# Patient Record
Sex: Male | Born: 1945 | Race: White | Hispanic: No | Marital: Married | Smoking: Never smoker
Health system: Southern US, Community
[De-identification: ages and names within clinical notes are randomized; demographics above are authoritative.]

## PROBLEM LIST (undated history)

## (undated) DIAGNOSIS — M1990 Unspecified osteoarthritis, unspecified site: Secondary | ICD-10-CM

## (undated) DIAGNOSIS — A045 Campylobacter enteritis: Secondary | ICD-10-CM

## (undated) DIAGNOSIS — Z8709 Personal history of other diseases of the respiratory system: Secondary | ICD-10-CM

## (undated) DIAGNOSIS — I4891 Unspecified atrial fibrillation: Principal | ICD-10-CM

## (undated) DIAGNOSIS — E785 Hyperlipidemia, unspecified: Secondary | ICD-10-CM

## (undated) DIAGNOSIS — C61 Malignant neoplasm of prostate: Secondary | ICD-10-CM

## (undated) DIAGNOSIS — G4733 Obstructive sleep apnea (adult) (pediatric): Secondary | ICD-10-CM

## (undated) DIAGNOSIS — N400 Enlarged prostate without lower urinary tract symptoms: Secondary | ICD-10-CM

## (undated) DIAGNOSIS — I10 Essential (primary) hypertension: Secondary | ICD-10-CM

## (undated) HISTORY — DX: Obstructive sleep apnea (adult) (pediatric): G47.33

---

## 1973-11-27 HISTORY — PX: OTHER SURGICAL HISTORY: SHX169

## 1998-06-01 ENCOUNTER — Encounter: Admission: RE | Admit: 1998-06-01 | Discharge: 1998-06-01 | Payer: Self-pay | Admitting: Family Medicine

## 1998-07-16 ENCOUNTER — Encounter: Admission: RE | Admit: 1998-07-16 | Discharge: 1998-07-16 | Payer: Self-pay | Admitting: Sports Medicine

## 2012-08-04 ENCOUNTER — Other Ambulatory Visit: Payer: Self-pay | Admitting: Orthopedic Surgery

## 2012-08-04 MED ORDER — DEXAMETHASONE SODIUM PHOSPHATE 10 MG/ML IJ SOLN: 10.0000 mg | Freq: Once | INTRAMUSCULAR | Status: DC

## 2012-08-04 NOTE — Progress Notes (Signed)
Preoperative surgical orders have been place into the Epic hospital system for William Combs on 08/04/2012, 12:14 PM  by Patrica Duel for surgery on 09/25/12.  Preop Knee Scope orders including IV Tylenol and IV Decadron as long as there are no contraindications to the above medications. Avel Peace, PA-C

## 2012-09-17 ENCOUNTER — Encounter (HOSPITAL_COMMUNITY): Payer: Self-pay | Admitting: Pharmacy Technician

## 2012-09-20 NOTE — Patient Instructions (Addendum)
William Combs  09/20/2012   Your procedure is scheduled on:  09/25/12 [id number]  Report to [facility] at 0715 AM.  Call this number if you have problems the morning of surgery: [phone]   Remember:   Do not eat food:After Midnight.  May have clear liquids:until Midnight .    Take these medicines the morning of surgery with A SIP OF WATER:    Do not wear jewelry,   Do not wear lotions, powders, or perfumes.  Do not shave 48 hours prior to surgery.   Do not bring valuables to the hospital.  Contacts, dentures or bridgework may not be worn into surgery.     Patients discharged the day of surgery will not be allowed to drive home.  Name and phone number of your driver:  SEE [facility]G INSTRUCTION SHEET    Please read over the following fact sheets that you were given: MRSA Information, coughing and deep breathing exercises, leg exericises, Incentive Spirometry Fact Sheet

## 2012-09-23 ENCOUNTER — Encounter (HOSPITAL_COMMUNITY)
Admission: RE | Admit: 2012-09-23 | Discharge: 2012-09-23 | Disposition: A | Discharge status: Home / Self Care | Payer: Medicare Other | Source: Ambulatory Visit | Attending: Orthopedic Surgery | Admitting: Orthopedic Surgery

## 2012-09-23 ENCOUNTER — Ambulatory Visit (HOSPITAL_COMMUNITY)
Admission: RE | Admit: 2012-09-23 | Discharge: 2012-09-23 | Disposition: A | Discharge status: Home / Self Care | Payer: Medicare Other | Source: Ambulatory Visit | Attending: Orthopedic Surgery | Admitting: Orthopedic Surgery

## 2012-09-23 ENCOUNTER — Encounter (HOSPITAL_COMMUNITY): Payer: Self-pay

## 2012-09-23 DIAGNOSIS — Z01818 Encounter for other preprocedural examination: Secondary | ICD-10-CM | POA: Insufficient documentation

## 2012-09-23 DIAGNOSIS — I10 Essential (primary) hypertension: Secondary | ICD-10-CM | POA: Insufficient documentation

## 2012-09-23 DIAGNOSIS — Z01812 Encounter for preprocedural laboratory examination: Secondary | ICD-10-CM | POA: Insufficient documentation

## 2012-09-23 HISTORY — DX: Essential (primary) hypertension: I10

## 2012-09-23 HISTORY — DX: Unspecified osteoarthritis, unspecified site: M19.90

## 2012-09-23 LAB — CBC
HCT: 41.1 % (ref 39.0–52.0)
MCH: 32.2 pg (ref 26.0–34.0)
MCHC: 33.8 g/dL (ref 30.0–36.0)
MCV: 95.1 fL (ref 78.0–100.0)
RDW: 12.6 % (ref 11.5–15.5)

## 2012-09-23 LAB — BASIC METABOLIC PANEL
BUN: 18 mg/dL (ref 6–23)
Calcium: 9.4 mg/dL (ref 8.4–10.5)
Creatinine, Ser: 0.94 mg/dL (ref 0.50–1.35)
GFR calc Af Amer: >90 mL/min (ref >90)
GFR calc non Af Amer: 85 mL/min — ABNORMAL LOW (ref >90)

## 2012-09-25 ENCOUNTER — Ambulatory Visit (HOSPITAL_COMMUNITY)
Admission: RE | Admit: 2012-09-25 | Discharge: 2012-09-25 | Disposition: A | Discharge status: Home / Self Care | Payer: Medicare Other | Source: Ambulatory Visit | Attending: Orthopedic Surgery | Admitting: Orthopedic Surgery

## 2012-09-25 ENCOUNTER — Encounter (HOSPITAL_COMMUNITY): Payer: Self-pay | Admitting: *Deleted

## 2012-09-25 ENCOUNTER — Ambulatory Visit (HOSPITAL_COMMUNITY): Payer: Medicare Other | Admitting: Anesthesiology

## 2012-09-25 ENCOUNTER — Encounter (HOSPITAL_COMMUNITY): Payer: Self-pay | Admitting: Anesthesiology

## 2012-09-25 ENCOUNTER — Encounter (HOSPITAL_COMMUNITY)
Admission: RE | Disposition: A | Discharge status: Home / Self Care | Payer: Self-pay | Source: Ambulatory Visit | Attending: Orthopedic Surgery

## 2012-09-25 DIAGNOSIS — IMO0002 Reserved for concepts with insufficient information to code with codable children: Secondary | ICD-10-CM | POA: Insufficient documentation

## 2012-09-25 DIAGNOSIS — Z01812 Encounter for preprocedural laboratory examination: Secondary | ICD-10-CM | POA: Insufficient documentation

## 2012-09-25 DIAGNOSIS — X58XXXA Exposure to other specified factors, initial encounter: Secondary | ICD-10-CM | POA: Insufficient documentation

## 2012-09-25 DIAGNOSIS — S83289A Other tear of lateral meniscus, current injury, unspecified knee, initial encounter: Secondary | ICD-10-CM | POA: Diagnosis present

## 2012-09-25 DIAGNOSIS — Z79899 Other long term (current) drug therapy: Secondary | ICD-10-CM | POA: Insufficient documentation

## 2012-09-25 DIAGNOSIS — I10 Essential (primary) hypertension: Secondary | ICD-10-CM | POA: Insufficient documentation

## 2012-09-25 HISTORY — PX: KNEE ARTHROSCOPY: SHX127

## 2012-09-25 SURGERY — ARTHROSCOPY, KNEE
Anesthesia: General | Site: Knee | Laterality: Left | Wound class: Clean

## 2012-09-25 MED ORDER — CHLORHEXIDINE GLUCONATE 4 % EX LIQD
60.0000 mL | Freq: Once | CUTANEOUS | Status: DC
  Filled 2012-09-25: qty 60

## 2012-09-25 MED ORDER — BUPIVACAINE-EPINEPHRINE 0.25% -1:200000 IJ SOLN
INTRAMUSCULAR | Status: AC
  Filled 2012-09-25: qty 1

## 2012-09-25 MED ORDER — OXYCODONE-ACETAMINOPHEN 5-325 MG PO TABS: 1.0000 | ORAL_TABLET | ORAL | Status: DC | PRN

## 2012-09-25 MED ORDER — CEFAZOLIN SODIUM-DEXTROSE 2-3 GM-% IV SOLR
2.0000 g | INTRAVENOUS | Status: AC
  Administered 2012-09-25: 2 g via INTRAVENOUS

## 2012-09-25 MED ORDER — LACTATED RINGERS IV SOLN: INTRAVENOUS | Status: DC

## 2012-09-25 MED ORDER — LACTATED RINGERS IR SOLN
Status: DC | PRN
  Administered 2012-09-25: 9000 mL

## 2012-09-25 MED ORDER — LACTATED RINGERS IV SOLN
INTRAVENOUS | Status: DC | PRN
  Administered 2012-09-25: 08:00:00 via INTRAVENOUS

## 2012-09-25 MED ORDER — BUPIVACAINE-EPINEPHRINE 0.25% -1:200000 IJ SOLN
INTRAMUSCULAR | Status: DC | PRN
  Administered 2012-09-25: 20 mL

## 2012-09-25 MED ORDER — ACETAMINOPHEN 10 MG/ML IV SOLN: 1000.0000 mg | Freq: Once | INTRAVENOUS | Status: DC

## 2012-09-25 MED ORDER — OXYCODONE-ACETAMINOPHEN 5-325 MG PO TABS
1.0000 | ORAL_TABLET | ORAL | Status: DC | PRN
  Administered 2012-09-25: 1 via ORAL
  Filled 2012-09-25: qty 1

## 2012-09-25 MED ORDER — FENTANYL CITRATE 0.05 MG/ML IJ SOLN
INTRAMUSCULAR | Status: AC
  Filled 2012-09-25: qty 2

## 2012-09-25 MED ORDER — FENTANYL CITRATE 0.05 MG/ML IJ SOLN
INTRAMUSCULAR | Status: DC | PRN
  Administered 2012-09-25: 50 ug via INTRAVENOUS
  Administered 2012-09-25 (×4): 25 ug via INTRAVENOUS
  Administered 2012-09-25: 100 ug via INTRAVENOUS

## 2012-09-25 MED ORDER — PROMETHAZINE HCL 25 MG/ML IJ SOLN: 6.2500 mg | INTRAMUSCULAR | Status: DC | PRN

## 2012-09-25 MED ORDER — METHOCARBAMOL 500 MG PO TABS: 500.0000 mg | ORAL_TABLET | Freq: Four times a day (QID) | ORAL | Status: DC | PRN

## 2012-09-25 MED ORDER — METHOCARBAMOL 500 MG PO TABS: 500.0000 mg | ORAL_TABLET | Freq: Four times a day (QID) | ORAL | Status: DC

## 2012-09-25 MED ORDER — ACETAMINOPHEN 10 MG/ML IV SOLN
INTRAVENOUS | Status: DC | PRN
  Administered 2012-09-25: 1000 mg via INTRAVENOUS

## 2012-09-25 MED ORDER — CEFAZOLIN SODIUM-DEXTROSE 2-3 GM-% IV SOLR
INTRAVENOUS | Status: AC
  Filled 2012-09-25: qty 50

## 2012-09-25 MED ORDER — DEXAMETHASONE SODIUM PHOSPHATE 10 MG/ML IJ SOLN
INTRAMUSCULAR | Status: DC | PRN
  Administered 2012-09-25: 10 mg via INTRAVENOUS

## 2012-09-25 MED ORDER — ACETAMINOPHEN 10 MG/ML IV SOLN
INTRAVENOUS | Status: AC
  Filled 2012-09-25: qty 100

## 2012-09-25 MED ORDER — PROPOFOL 10 MG/ML IV BOLUS
INTRAVENOUS | Status: DC | PRN
  Administered 2012-09-25: 150 mg via INTRAVENOUS

## 2012-09-25 MED ORDER — MIDAZOLAM HCL 5 MG/5ML IJ SOLN
INTRAMUSCULAR | Status: DC | PRN
  Administered 2012-09-25: 2 mg via INTRAVENOUS

## 2012-09-25 MED ORDER — DEXTROSE 5 % IV SOLN: 3.0000 g | INTRAVENOUS | Status: DC

## 2012-09-25 MED ORDER — ONDANSETRON HCL 4 MG/2ML IJ SOLN
INTRAMUSCULAR | Status: DC | PRN
  Administered 2012-09-25: 4 mg via INTRAVENOUS

## 2012-09-25 MED ORDER — FENTANYL CITRATE 0.05 MG/ML IJ SOLN
25.0000 ug | INTRAMUSCULAR | Status: DC | PRN
  Administered 2012-09-25: 50 ug via INTRAVENOUS

## 2012-09-25 MED ORDER — SODIUM CHLORIDE 0.9 % IV SOLN: INTRAVENOUS | Status: DC

## 2012-09-25 MED ORDER — BUPIVACAINE-EPINEPHRINE PF 0.25-1:200000 % IJ SOLN
INTRAMUSCULAR | Status: AC
  Filled 2012-09-25: qty 30

## 2012-09-25 MED ORDER — MEPERIDINE HCL 50 MG/ML IJ SOLN: 6.2500 mg | INTRAMUSCULAR | Status: DC | PRN

## 2012-09-25 SURGICAL SUPPLY — 24 items
BLADE 4.2CUDA (BLADE) ×2 IMPLANT
CLOTH BEACON ORANGE TIMEOUT ST (SAFETY) ×2 IMPLANT
CUFF TOURN SGL QUICK 34 (TOURNIQUET CUFF) ×2
CUFF TRNQT CYL 34X4X40X1 (TOURNIQUET CUFF) ×1 IMPLANT
DRAPE U-SHAPE 47X51 STRL (DRAPES) ×2 IMPLANT
DRSG EMULSION OIL 3X3 NADH (GAUZE/BANDAGES/DRESSINGS) ×2 IMPLANT
DRSG PAD ABDOMINAL 8X10 ST (GAUZE/BANDAGES/DRESSINGS) ×2 IMPLANT
DURAPREP 26ML APPLICATOR (WOUND CARE) ×2 IMPLANT
GLOVE BIO SURGEON STRL SZ7.5 (GLOVE) ×2 IMPLANT
GLOVE BIO SURGEON STRL SZ8 (GLOVE) ×2 IMPLANT
GLOVE BIOGEL PI IND STRL 8 (GLOVE) ×1 IMPLANT
GLOVE BIOGEL PI INDICATOR 8 (GLOVE) ×4
GOWN STRL NON-REIN LRG LVL3 (GOWN DISPOSABLE) ×2 IMPLANT
MANIFOLD NEPTUNE II (INSTRUMENTS) ×3 IMPLANT
PACK ARTHROSCOPY WL (CUSTOM PROCEDURE TRAY) ×2 IMPLANT
PACK ICE MAXI GEL EZY WRAP (MISCELLANEOUS) ×6 IMPLANT
PADDING CAST COTTON 6X4 STRL (CAST SUPPLIES) ×2 IMPLANT
POSITIONER SURGICAL ARM (MISCELLANEOUS) ×2 IMPLANT
SET ARTHROSCOPY TUBING (MISCELLANEOUS) ×2
SET ARTHROSCOPY TUBING LN (MISCELLANEOUS) ×1 IMPLANT
SUT ETHILON 4 0 PS 2 18 (SUTURE) ×2 IMPLANT
TOWEL OR 17X26 10 PK STRL BLUE (TOWEL DISPOSABLE) ×2 IMPLANT
WAND 90 DEG TURBOVAC W/CORD (SURGICAL WAND) ×2 IMPLANT
WRAP KNEE MAXI GEL POST OP (GAUZE/BANDAGES/DRESSINGS) ×3 IMPLANT

## 2012-09-25 NOTE — Interval H&P Note (Signed)
History and Physical Interval Note:  09/25/2012 8:44 AM  William Combs  has presented today for surgery, with the diagnosis of left knee lateral meniscus tear  The various methods of treatment have been discussed with the patient and family. After consideration of risks, benefits and other options for treatment, the patient has consented to  Procedure(s) (LRB) with comments: ARTHROSCOPY KNEE (Left) - with Debridement as a surgical intervention .  The patient's history has been reviewed, patient examined, no change in status, stable for surgery.  I have reviewed the patient's chart and labs.  Questions were answered to the patient's satisfaction.     Loanne Drilling

## 2012-09-25 NOTE — Op Note (Signed)
Preoperative diagnosis-  Left knee lateral meniscal tear  Postoperative diagnosis Left- knee lateral meniscal tear   Plus Left medial meniscal tear  Procedure- Left knee arthroscopy with medial and lateral Meniscal debridement    Surgeon- Gus Rankin. Shmiel Morton, MD  Anesthesia-General  EBL-  minimal Complications- None  Condition- PACU - hemodynamically stable.  Brief clinical note- -William Combs is a 66 y.o.  male with a several month history of left knee pain and mechanical symptoms. Exam and history suggested lateral meniscal tear confirmed by MRI. The patient presents now for arthroscopy and debridement   Procedure in detail -       After successful administration of General anesthetic, a tourmiquet is placed high on the Left  thigh and the Left lower extremity is prepped and draped in the usual sterile fashion. Time out is performed by the surgical team. Standard superomedial and inferolateral portal sites are marked and incisions made with an 11 blade. The inflow cannula is passed through the superomedial portal and camera through the inferolateral portal and inflow is initiated. Arthroscopic visualization proceeds.      The undersurface of the patella and trochlea are visualized and they are normal. The medial and lateral gutters are visualized and there are  no loose bodies. Flexion and valgus force is applied to the knee and the medial compartment is entered. A spinal needle is passed into the joint through the site marked for the inferomedial portal. A small incision is made and the dilator passed into the joint. The findings for the medial compartment are small tear posterior horn medial meniscus . The tear is debrided to a stable base with baskets and a shaver and sealed off with the Arthrocare.     The intercondylar notch is visualized and the ACL appears normal. The lateral compartment is entered and the findings are degenerative tear body and posterior horn lateral meniscus  without any associated chondral defects. The tear is debrided to a stable base with baskets and a shaver and sealed off with the Arthrocare.      The joint is again inspected and there are no other tears, defects or loose bodies identified. The arthroscopic equipment is then removed from the inferior portals which are closed with interrupted 4-0 nylon. 20 ml of .25% Marcaine with epinephrine are injected through the inflow cannula and the cannula is then removed and the portal closed with nylon. The incisions are cleaned and dried and a bulky sterile dressing is applied. The patient is then awakened and transported to recovery in stable condition.   09/25/2012, 9:41 AM

## 2012-09-25 NOTE — H&P (Signed)
  CC- William Combs is a 66 y.o. male who presents with left knee pain.  HPI- . Knee Pain: Patient presents with knee pain involving the  left knee. Onset of the symptoms was several months ago. Inciting event: none known. Current symptoms include giving out, pain located laterally and stiffness. Pain is aggravated by lateral movements, pivoting, rising after sitting and squatting.  Patient has had no prior knee problems. Evaluation to date: MRI: abnormal lateral meniscal tear. Treatment to date: rest.  Past Medical History  Diagnosis Date  . Hypertension   . Asthma     hxof athletically induced asthma   . Arthritis     Past Surgical History  Procedure Date  . Right knee surgery    . Wisdom tooth extraction   . Refractive surgery     Prior to Admission medications   Medication Sig Start Date End Date Taking? Authorizing Provider  aspirin EC 81 MG tablet Take 81 mg by mouth daily.    Historical Provider, MD  hydroxypropyl methylcellulose (ISOPTO TEARS) 2.5 % ophthalmic solution Place 1 drop into both eyes daily.    Historical Provider, MD  Multiple Vitamin (MULTIVITAMIN WITH MINERALS) TABS Take 1 tablet by mouth daily.    Historical Provider, MD  rosuvastatin (CRESTOR) 10 MG tablet Take 5 mg by mouth every evening.    Historical Provider, MD  valsartan-hydrochlorothiazide (DIOVAN-HCT) 160-25 MG per tablet Take 0.5 tablets by mouth every evening.    Historical Provider, MD   KNEE EXAM antalgic gait, soft tissue tenderness over lateral joint line, collateral ligaments intact, normal ipsilateral hip exam  Physical Examination: General appearance - alert, well appearing, and in no distress Mental status - alert, oriented to person, place, and time Chest - clear to auscultation, no wheezes, rales or rhonchi, symmetric air entry Heart - normal rate, regular rhythm, normal S1, S2, no murmurs, rubs, clicks or gallops Abdomen - soft, nontender, nondistended, no masses or  organomegaly Neurological - alert, oriented, normal speech, no focal findings or movement disorder noted   Asessment/Plan--- Left knee lateral meniscal tear- - Plan left knee arthroscopy with meniscal debridement. Procedure risks and potential comps discussed with patient who elects to proceed. Goals are decreased pain and increased function with a high likelihood of achieving both

## 2012-09-25 NOTE — Anesthesia Postprocedure Evaluation (Signed)
  Anesthesia Post-op Note  Patient: William Combs  Procedure(s) Performed: Procedure(s) (LRB): ARTHROSCOPY KNEE (Left)  Patient Location: PACU  Anesthesia Type: General  Level of Consciousness: awake and alert   Airway and Oxygen Therapy: Patient Spontanous Breathing  Post-op Pain: mild  Post-op Assessment: Post-op Vital signs reviewed, Patient's Cardiovascular Status Stable, Respiratory Function Stable, Patent Airway and No signs of Nausea or vomiting  Post-op Vital Signs: stable  Complications: No apparent anesthesia complications

## 2012-09-25 NOTE — Anesthesia Preprocedure Evaluation (Signed)
Anesthesia Evaluation  Patient identified by MRN, date of birth, ID band Patient awake    Reviewed: Allergy & Precautions, H&P , NPO status , Patient's Chart, lab work & pertinent test results  Airway Mallampati: II TM Distance: >3 FB Neck ROM: Full    Dental No notable dental hx.    Pulmonary neg pulmonary ROS,  breath sounds clear to auscultation  Pulmonary exam normal       Cardiovascular hypertension, Pt. on medications negative cardio ROS  Rhythm:Regular Rate:Normal     Neuro/Psych negative neurological ROS  negative psych ROS   GI/Hepatic negative GI ROS, Neg liver ROS,   Endo/Other  negative endocrine ROS  Renal/GU negative Renal ROS  negative genitourinary   Musculoskeletal negative musculoskeletal ROS (+)   Abdominal   Peds negative pediatric ROS (+)  Hematology negative hematology ROS (+)   Anesthesia Other Findings   Reproductive/Obstetrics negative OB ROS                           Anesthesia Physical Anesthesia Plan  ASA: II  Anesthesia Plan: General   Post-op Pain Management:    Induction: Intravenous  Airway Management Planned: LMA  Additional Equipment:   Intra-op Plan:   Post-operative Plan: Extubation in OR  Informed Consent: I have reviewed the patients History and Physical, chart, labs and discussed the procedure including the risks, benefits and alternatives for the proposed anesthesia with the patient or authorized representative who has indicated his/her understanding and acceptance.   Dental advisory given  Plan Discussed with: CRNA  Anesthesia Plan Comments:         Anesthesia Quick Evaluation  

## 2012-09-25 NOTE — Preoperative (Signed)
Beta Blockers   Reason not to administer Beta Blockers:Not Applicable 

## 2012-09-25 NOTE — Progress Notes (Signed)
Pt is going on 8 hour car ride on Friday. Dr Lequita Halt has ordered TED hose. Pt measured for TED hose and family instructed in application and removal of TED hose. They are able to teach back. Instructed in crutch walking. They are able to teach back.

## 2012-09-25 NOTE — Transfer of Care (Signed)
Immediate Anesthesia Transfer of Care Note  Patient: William Combs  Procedure(s) Performed: Procedure(s) (LRB) with comments: ARTHROSCOPY KNEE (Left) - with medial and lateral meniscus Debridement  Patient Location: PACU  Anesthesia Type:General  Level of Consciousness: awake, alert , oriented and patient cooperative  Airway & Oxygen Therapy: Patient Spontanous Breathing and Patient connected to face mask oxygen  Post-op Assessment: Report given to PACU RN and Post -op Vital signs reviewed and stable  Post vital signs: Reviewed and stable  Complications: No apparent anesthesia complications

## 2012-09-26 ENCOUNTER — Encounter (HOSPITAL_COMMUNITY): Payer: Self-pay | Admitting: Orthopedic Surgery

## 2013-05-15 IMAGING — CR DG CHEST 2V
2 series · 2 of 2 positions shown · non-contrast
Comparison: None.

CLINICAL DATA: Hypertension

CHEST - 2 VIEW

[w chest pa]
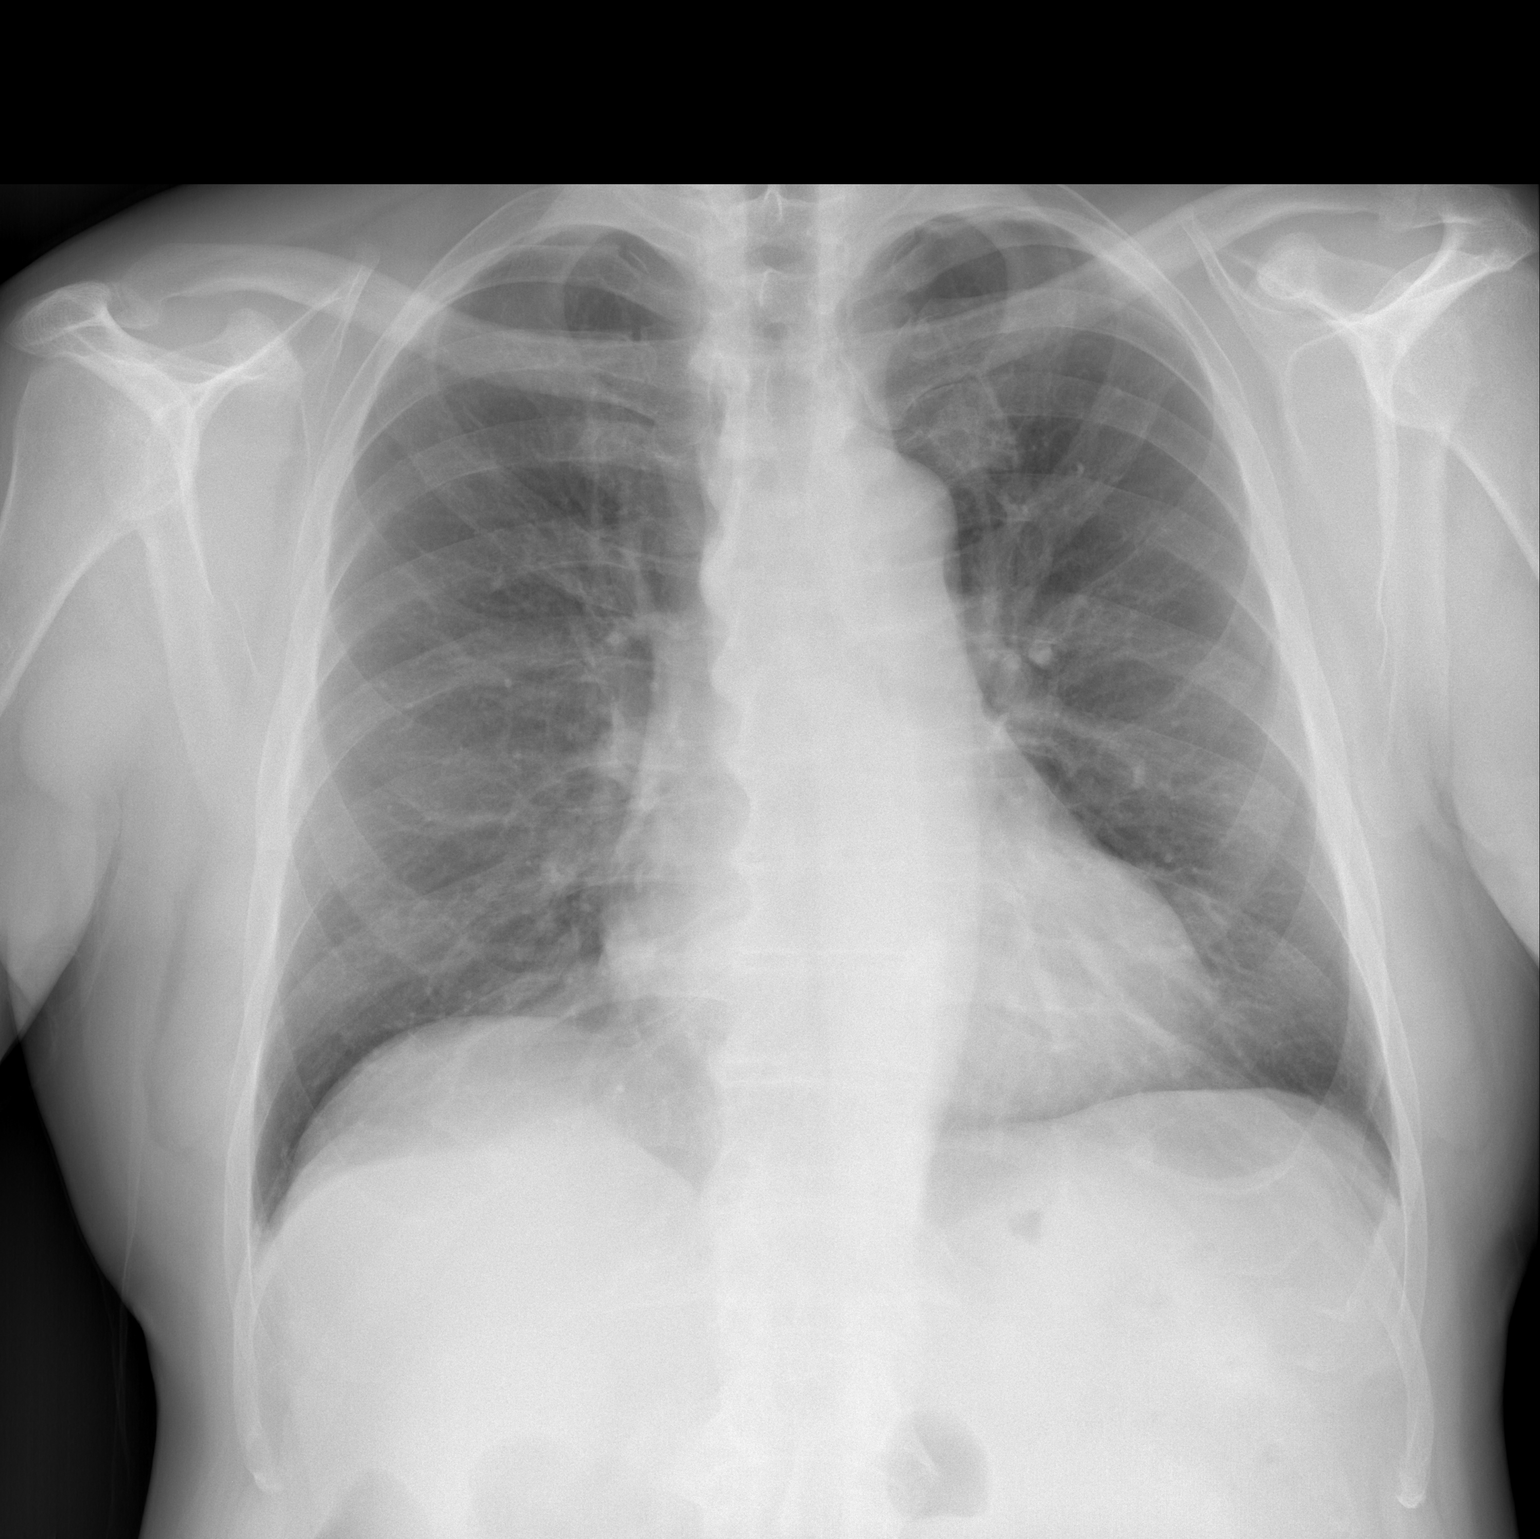

[w chest lat]
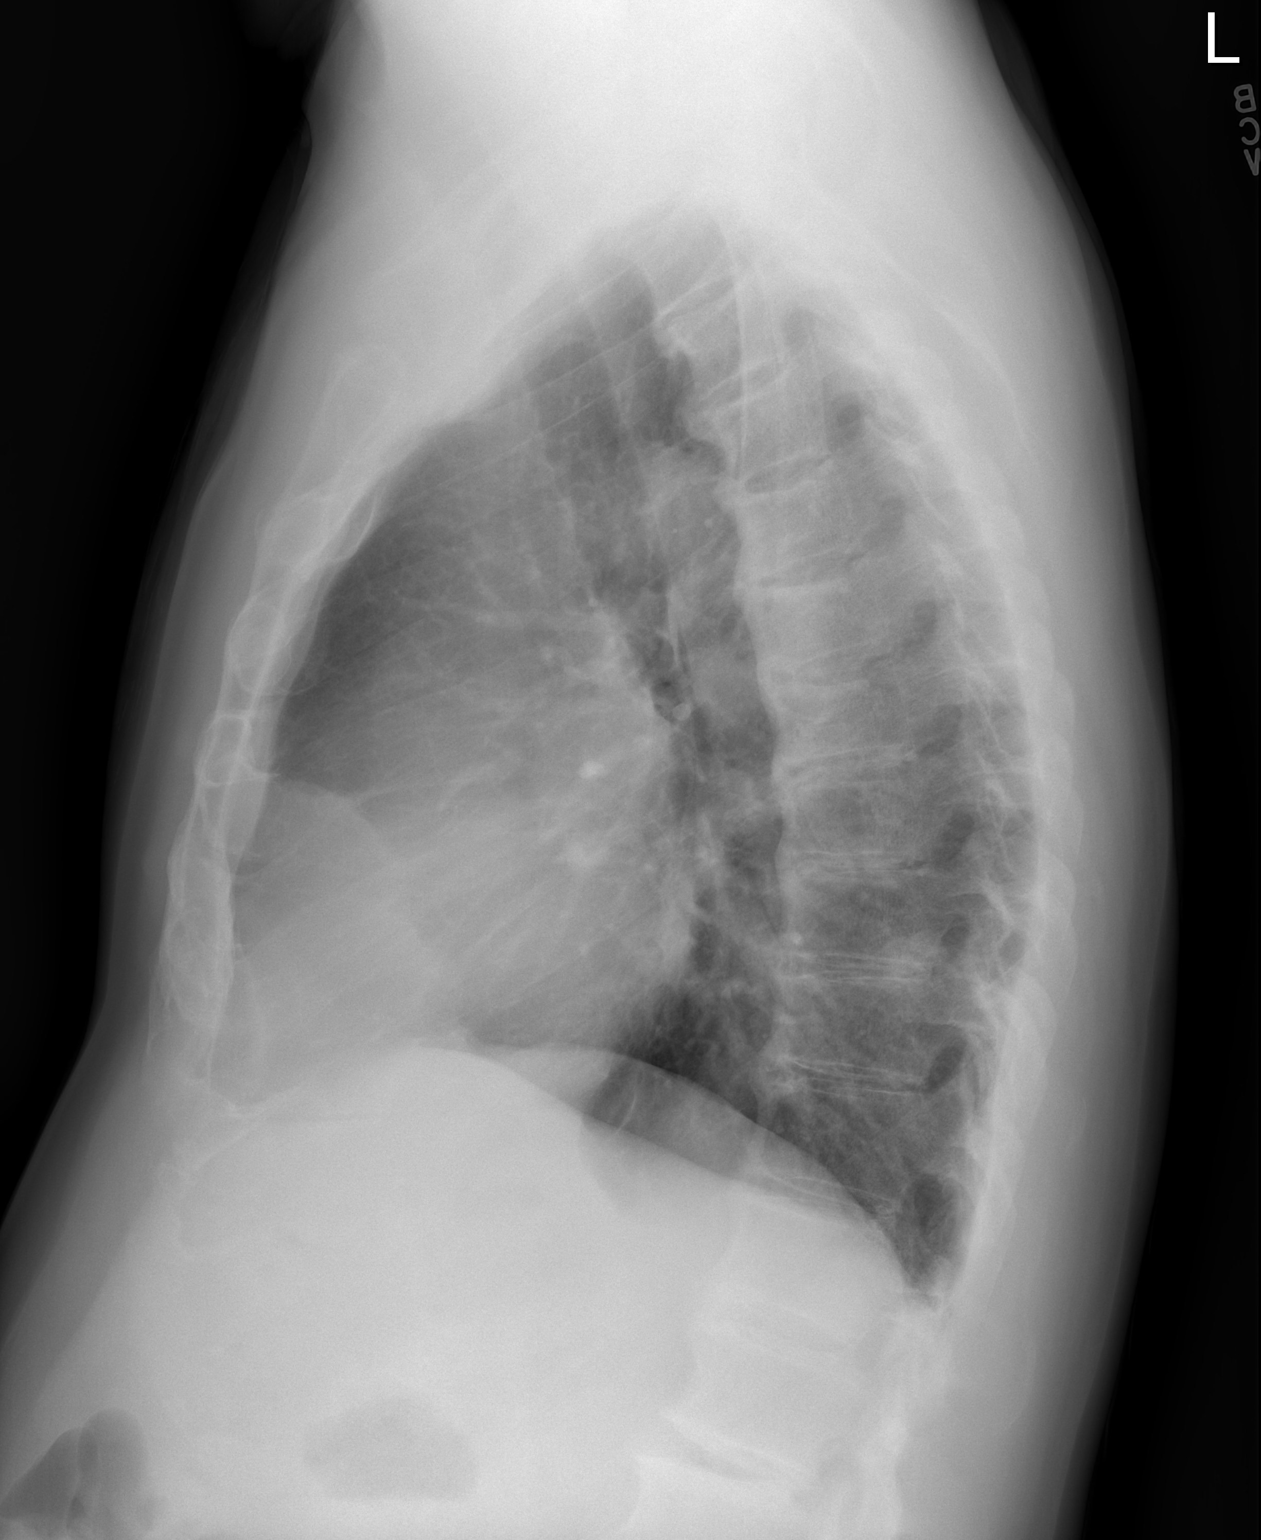

[2 of 2 positions shown; findings below may reference images not displayed]

FINDINGS: The heart and pulmonary vascularity are within normal
limits.  The lungs are clear bilaterally.  No acute bony
abnormality is seen.
IMPRESSION: No acute abnormality in the chest.

## 2013-12-16 HISTORY — PX: PROSTATE BIOPSY: SHX241

## 2014-01-21 ENCOUNTER — Encounter: Payer: Self-pay | Admitting: Radiation Oncology

## 2014-01-21 DIAGNOSIS — C61 Malignant neoplasm of prostate: Secondary | ICD-10-CM | POA: Insufficient documentation

## 2014-01-21 NOTE — Progress Notes (Signed)
GU Location of Tumor / Histology: prostate  If Prostate Cancer, Gleason Score is (3 + 3) and PSA is (4.62 on 10/07/13) 02/2013 PSA 5.3 2011  PSA 4.2 2010  PSA 3.9  Patient presented 9 months ago with signs/symptoms of: elevated PSA  Biopsies of prostate (if applicable) revealed: adenocarcinoma, Gleason 3+3=6, 6/6 cores on right side, 0/6 cores on left side, volume 48 cc  Past/Anticipated interventions by urology, if any: surveillance  Past/Anticipated interventions by medical oncology, if any: none  Weight changes, if any: none  Bowel/Bladder complaints, if any:  Some frequency, stream starts/stops, urgency, weak stream, nocturia x 2, IPSS 6  Nausea/Vomiting, if any: no  Pain issues, if any:  Chronic knee pain, soreness  SAFETY ISSUES:  Prior radiation? no  Pacemaker/ICD? no  Possible current pregnancy? na  Is the patient on methotrexate? no  Current Complaints / other details:  Retired, married, 1 son, 1 daughter

## 2014-01-22 ENCOUNTER — Ambulatory Visit: Admission: RE | Admit: 2014-01-22 | Payer: BC Managed Care – PPO | Source: Ambulatory Visit

## 2014-01-22 ENCOUNTER — Ambulatory Visit
Admission: RE | Admit: 2014-01-22 | Discharge: 2014-01-22 | Disposition: A | Discharge status: Home / Self Care | Payer: BC Managed Care – PPO | Source: Ambulatory Visit | Attending: Radiation Oncology | Admitting: Radiation Oncology

## 2014-01-22 HISTORY — DX: Benign prostatic hyperplasia without lower urinary tract symptoms: N40.0

## 2014-01-22 HISTORY — DX: Malignant neoplasm of prostate: C61

## 2014-01-29 ENCOUNTER — Encounter: Payer: Self-pay | Admitting: Radiation Oncology

## 2014-01-29 ENCOUNTER — Ambulatory Visit
Admission: RE | Admit: 2014-01-29 | Discharge: 2014-01-29 | Disposition: A | Discharge status: Home / Self Care | Payer: Medicare Other | Source: Ambulatory Visit | Attending: Radiation Oncology | Admitting: Radiation Oncology

## 2014-01-29 ENCOUNTER — Ambulatory Visit
Admission: RE | Admit: 2014-01-29 | Discharge: 2014-01-29 | Disposition: A | Discharge status: Home / Self Care | Payer: PRIVATE HEALTH INSURANCE | Source: Ambulatory Visit | Attending: Radiation Oncology | Admitting: Radiation Oncology

## 2014-01-29 VITALS — BP 135/82 | HR 69 | Temp 98.3°F | Resp 20 | Wt 197.0 lb

## 2014-01-29 DIAGNOSIS — C61 Malignant neoplasm of prostate: Secondary | ICD-10-CM

## 2014-01-29 DIAGNOSIS — Z79899 Other long term (current) drug therapy: Secondary | ICD-10-CM | POA: Insufficient documentation

## 2014-01-29 DIAGNOSIS — I10 Essential (primary) hypertension: Secondary | ICD-10-CM | POA: Insufficient documentation

## 2014-01-29 DIAGNOSIS — E7800 Pure hypercholesterolemia, unspecified: Secondary | ICD-10-CM | POA: Insufficient documentation

## 2014-01-29 DIAGNOSIS — J45909 Unspecified asthma, uncomplicated: Secondary | ICD-10-CM | POA: Insufficient documentation

## 2014-01-29 DIAGNOSIS — N400 Enlarged prostate without lower urinary tract symptoms: Secondary | ICD-10-CM | POA: Insufficient documentation

## 2014-01-29 DIAGNOSIS — Z7982 Long term (current) use of aspirin: Secondary | ICD-10-CM | POA: Insufficient documentation

## 2014-01-29 NOTE — Progress Notes (Signed)
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 male who is seen today through the courtesy of Dr. 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 He  had a slow rise in his PSA over the past 4 years. His PSA was 3.9  in 2010, 4.2 in 2011, 5.3 in April 2014, then falling to 3.9 after antibiotics, but rising to 4.62 by 10/07/2013. At that time his percentage free PSA was low at 9%. He underwent ultrasound-guided biopsies on 12/16/2013. He was found have Gleason 6 (3+3) involving less than 5% of one core from right lateral base, 10% of one core from the right base, 5% of one core from the right lateral mid gland, 30% of one core from the right mid gland, 5% of one core from right lateral apex and 10% of one core from the right apex. All biopsies on the left were benign. His gland volume was approximately 48 cc. He is doing well from a GU and GI standpoint. His I PSS score is 6. He is potent.  PREVIOUS RADIATION THERAPY: No   PAST MEDICAL HISTORY:  has a past medical history of Hypertension; Asthma; Arthritis; Prostate cancer (12/16/13); Hypercholesteremia; Joint pain; and BPH (benign prostatic hyperplasia).     PAST SURGICAL HISTORY:  Past Surgical History  Procedure Laterality Date  . Right knee surgery     . Wisdom tooth extraction    . Refractive surgery    . Knee arthroscopy  09/25/2012    Procedure: ARTHROSCOPY KNEE;  Surgeon: Gearlean Alf, MD;  Location: [facility];  Service: Orthopedics;  Laterality: Left;  with medial and lateral meniscus Debridement  . Prostate biopsy  12/16/13    Gleason 6, volume 48 gm      FAMILY HISTORY: family history includes Dementia in his mother; Heart disease in his brother; Parkinson's disease in his father. No family history of prostate cancer. His father died of Parkinson's disease at 31, in his mother died from complications of dementia at 91.   SOCIAL HISTORY:  reports that he has never smoked. He has never used smokeless tobacco. He reports that he drinks alcohol. He reports that he does not use illicit drugs. Married, 2 children. Retired Air cabin crew and Astronomer.   ALLERGIES: Review of patient's allergies indicates no known allergies.   MEDICATIONS:  Current Outpatient Prescriptions  Medication Sig Dispense Refill  . aspirin EC 81 MG tablet Take 81 mg by mouth daily.      . hydroxypropyl methylcellulose (ISOPTO TEARS) 2.5 % ophthalmic solution Place 1 drop into both eyes daily.      . Multiple Vitamin (MULTIVITAMIN WITH MINERALS) TABS Take 1 tablet by mouth daily.      . rosuvastatin (CRESTOR) 10 MG tablet Take 5 mg by mouth every evening.      . valsartan-hydrochlorothiazide (DIOVAN-HCT) 160-25 MG per tablet Take 0.5 tablets by mouth every evening.       No current facility-administered medications for this encounter.     REVIEW OF SYSTEMS:  Pertinent items are noted in HPI.    PHYSICAL EXAM:  weight is 197 lb (89.359 kg). His oral temperature is 98.3 F (36.8 C). His blood pressure is 135/82 and his pulse is 69. His respiration is 20.   Alert and oriented. Head and neck examination: Grossly unremarkable. Chest: Lungs clear. Abdomen: Without masses organomegaly. Genitalia: Unremarkable to inspection. Rectal: The prostate gland is slightly enlarged and there is subtle induration along the right gland compared to the left. There is no periprosthetic tumor extension. Extremities: Without edema.   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 I explained to the patient that his prognosis is related to his stage, PSA level, and Gleason score. All are favorable. Other prognostic factors include PSA doubling time and disease volume, and these are favorable. We discussed surgery versus close surveillance/observation versus radiation therapy. Radiation therapy options include seed implantation versus 8 weeks of external beam/IMRT. We had a lengthy discussion regarding seed implantation, and this is his choice of therapy. We discussed the potential acute and late toxicities of seed implantation. We also discussed radiation safety issues. A CT arch will be performed today to make sure that he does not need to be downsized for seed implantation. Consent is signed today. He was given literature for review.   PLAN: As discussed above. Assuming his CT arch is satisfactory, we'll get him scheduled for seed implantation with the next 4-8 weeks.   I spent 60 minutes minutes face to face with the patient and more than 50% of that time was spent in counseling and/or coordination of care.

## 2014-01-29 NOTE — Progress Notes (Signed)
Please see the Nurse Progress Note in the MD Initial Consult Encounter for this patient. 

## 2014-01-29 NOTE — Progress Notes (Signed)
CC: Dr. Rana Snare     CT simulation/treatment planning note: The patient was taken to the CT simulator. His pelvis was scanned. The CT data set was sent to the planning system for contouring of his prostate. His prostate volume is 46.2 cc, correlating well with Dr. Risa Grill is measurement of 48 cc. The prostate gland is projected over the pubic arch and there is no significant interference. He is a candidate for seed implantation. I prescribing 14,500 cGy utilizing I-125 seeds. He'll be implanted with Nucletron seed Selectron system.

## 2014-02-05 ENCOUNTER — Telehealth: Payer: Self-pay | Admitting: *Deleted

## 2014-02-05 NOTE — Telephone Encounter (Signed)
CALLED PATIENT TO INFORM OF IMPLANT DATE, LVM FOR A RETURN CALL 

## 2014-02-06 ENCOUNTER — Other Ambulatory Visit: Payer: Self-pay | Admitting: Urology

## 2014-02-16 ENCOUNTER — Telehealth: Payer: Self-pay | Admitting: *Deleted

## 2014-02-16 NOTE — Telephone Encounter (Signed)
CALLED PATIENT TO ASK A QUESTION, LVM FOR A RETURN CALL 

## 2014-02-17 ENCOUNTER — Ambulatory Visit (HOSPITAL_BASED_OUTPATIENT_CLINIC_OR_DEPARTMENT_OTHER)
Admission: RE | Admit: 2014-02-17 | Discharge: 2014-02-17 | Disposition: A | Discharge status: Home / Self Care | Payer: Medicare Other | Source: Ambulatory Visit | Attending: Urology | Admitting: Urology

## 2014-02-17 ENCOUNTER — Ambulatory Visit (HOSPITAL_COMMUNITY): Admission: RE | Admit: 2014-02-17 | Payer: Medicare Other | Source: Ambulatory Visit

## 2014-02-17 ENCOUNTER — Encounter (HOSPITAL_BASED_OUTPATIENT_CLINIC_OR_DEPARTMENT_OTHER)
Admission: RE | Admit: 2014-02-17 | Discharge: 2014-02-17 | Disposition: A | Discharge status: Home / Self Care | Payer: Medicare Other | Source: Ambulatory Visit | Attending: Urology | Admitting: Urology

## 2014-02-17 ENCOUNTER — Other Ambulatory Visit: Payer: Self-pay

## 2014-02-17 DIAGNOSIS — J45909 Unspecified asthma, uncomplicated: Secondary | ICD-10-CM | POA: Insufficient documentation

## 2014-02-17 DIAGNOSIS — I10 Essential (primary) hypertension: Secondary | ICD-10-CM | POA: Insufficient documentation

## 2014-03-10 ENCOUNTER — Telehealth: Payer: Self-pay | Admitting: *Deleted

## 2014-03-10 NOTE — Telephone Encounter (Signed)
Called patient to remind of appt. For lab on 03-11-14, spoke with patient and he is aware of this appt.

## 2014-03-11 LAB — COMPREHENSIVE METABOLIC PANEL
ALBUMIN: 4.3 g/dL (ref 3.5–5.2)
ALT: 20 U/L (ref 0–53)
AST: 23 U/L (ref 0–37)
Alkaline Phosphatase: 98 U/L (ref 39–117)
BUN: 23 mg/dL (ref 6–23)
CHLORIDE: 101 meq/L (ref 96–112)
CO2: 28 mEq/L (ref 19–32)
CREATININE: 1.13 mg/dL (ref 0.50–1.35)
Calcium: 9.8 mg/dL (ref 8.4–10.5)
GFR calc Af Amer: 75 mL/min — ABNORMAL LOW (ref >90)
GFR calc non Af Amer: 65 mL/min — ABNORMAL LOW (ref >90)
Glucose, Bld: 99 mg/dL (ref 70–99)
Potassium: 5 mEq/L (ref 3.7–5.3)
Sodium: 139 mEq/L (ref 137–147)
TOTAL PROTEIN: 7.3 g/dL (ref 6.0–8.3)
Total Bilirubin: 0.5 mg/dL (ref 0.3–1.2)

## 2014-03-11 LAB — CBC
HCT: 41.7 % (ref 39.0–52.0)
Hemoglobin: 14.4 g/dL (ref 13.0–17.0)
MCH: 32.5 pg (ref 26.0–34.0)
MCHC: 34.5 g/dL (ref 30.0–36.0)
MCV: 94.1 fL (ref 78.0–100.0)
PLATELETS: 201 10*3/uL (ref 150–400)
RBC: 4.43 MIL/uL (ref 4.22–5.81)
RDW: 13 % (ref 11.5–15.5)
WBC: 7.4 10*3/uL (ref 4.0–10.5)

## 2014-03-11 LAB — PROTIME-INR
INR: 0.92 (ref 0.00–1.49)
Prothrombin Time: 12.2 seconds (ref 11.6–15.2)

## 2014-03-11 LAB — APTT: APTT: 30 s (ref 24–37)

## 2014-03-16 ENCOUNTER — Encounter (HOSPITAL_BASED_OUTPATIENT_CLINIC_OR_DEPARTMENT_OTHER): Payer: Self-pay | Admitting: *Deleted

## 2014-03-16 NOTE — Progress Notes (Signed)
03/16/14 1153  OBSTRUCTIVE SLEEP APNEA  Have you ever been diagnosed with sleep apnea through a sleep study? No  Do you snore loudly (loud enough to be heard through closed doors)?  1  Do you often feel tired, fatigued, or sleepy during the daytime? 0  Has anyone observed you stop breathing during your sleep? 0  Do you have, or are you being treated for high blood pressure? 1  BMI more than 35 kg/m2? 0  Age over 69 years old? 1  Neck circumference greater than 40 cm/16 inches? 0  Gender: 1  Obstructive Sleep Apnea Score 4  Score 4 or greater  Results sent to PCP

## 2014-03-16 NOTE — Progress Notes (Signed)
NPO AFTER MN. ARRIVE AT 0700. CURRENT LAB RESULTS, EKG AND CXR IN EPIC AND CHART. WILL DO FLEET ENEMA AM DOS.

## 2014-03-17 ENCOUNTER — Telehealth: Payer: Self-pay | Admitting: *Deleted

## 2014-03-17 NOTE — Anesthesia Preprocedure Evaluation (Addendum)
Anesthesia Evaluation  Patient identified by MRN, date of birth, ID band Patient awake    Reviewed: Allergy & Precautions, H&P , NPO status , Patient's Chart, lab work & pertinent test results  Airway Mallampati: II TM Distance: <3 FB Neck ROM: Full    Dental no notable dental hx.    Pulmonary neg pulmonary ROS,  breath sounds clear to auscultation  Pulmonary exam normal       Cardiovascular hypertension, Pt. on medications Rhythm:Regular Rate:Normal     Neuro/Psych negative neurological ROS  negative psych ROS   GI/Hepatic negative GI ROS, Neg liver ROS,   Endo/Other  negative endocrine ROS  Renal/GU negative Renal ROS  negative genitourinary   Musculoskeletal negative musculoskeletal ROS (+)   Abdominal   Peds negative pediatric ROS (+)  Hematology negative hematology ROS (+)   Anesthesia Other Findings   Reproductive/Obstetrics negative OB ROS                           Anesthesia Physical Anesthesia Plan  ASA: II  Anesthesia Plan: General   Post-op Pain Management:    Induction: Intravenous  Airway Management Planned: LMA  Additional Equipment:   Intra-op Plan:   Post-operative Plan: Extubation in OR  Informed Consent: I have reviewed the patients History and Physical, chart, labs and discussed the procedure including the risks, benefits and alternatives for the proposed anesthesia with the patient or authorized representative who has indicated his/her understanding and acceptance.   Dental advisory given  Plan Discussed with: CRNA and Surgeon  Anesthesia Plan Comments:         Anesthesia Quick Evaluation  

## 2014-03-17 NOTE — Telephone Encounter (Signed)
Called patient to remind of implant for 03-18-14, lvm for a return call

## 2014-03-18 ENCOUNTER — Ambulatory Visit (HOSPITAL_BASED_OUTPATIENT_CLINIC_OR_DEPARTMENT_OTHER): Payer: Medicare Other | Admitting: Anesthesiology

## 2014-03-18 ENCOUNTER — Encounter (HOSPITAL_BASED_OUTPATIENT_CLINIC_OR_DEPARTMENT_OTHER)
Admission: RE | Disposition: A | Discharge status: Home / Self Care | Payer: Self-pay | Source: Ambulatory Visit | Attending: Urology

## 2014-03-18 ENCOUNTER — Encounter: Payer: Self-pay | Admitting: Radiation Oncology

## 2014-03-18 ENCOUNTER — Encounter (HOSPITAL_BASED_OUTPATIENT_CLINIC_OR_DEPARTMENT_OTHER): Payer: Medicare Other | Admitting: Anesthesiology

## 2014-03-18 ENCOUNTER — Ambulatory Visit (HOSPITAL_COMMUNITY): Payer: Medicare Other

## 2014-03-18 ENCOUNTER — Ambulatory Visit (HOSPITAL_BASED_OUTPATIENT_CLINIC_OR_DEPARTMENT_OTHER)
Admission: RE | Admit: 2014-03-18 | Discharge: 2014-03-18 | Disposition: A | Discharge status: Home / Self Care | Payer: Medicare Other | Source: Ambulatory Visit | Attending: Urology | Admitting: Urology

## 2014-03-18 ENCOUNTER — Encounter (HOSPITAL_BASED_OUTPATIENT_CLINIC_OR_DEPARTMENT_OTHER): Payer: Self-pay | Admitting: *Deleted

## 2014-03-18 DIAGNOSIS — M129 Arthropathy, unspecified: Secondary | ICD-10-CM | POA: Insufficient documentation

## 2014-03-18 DIAGNOSIS — J45909 Unspecified asthma, uncomplicated: Secondary | ICD-10-CM | POA: Insufficient documentation

## 2014-03-18 DIAGNOSIS — Z7982 Long term (current) use of aspirin: Secondary | ICD-10-CM | POA: Insufficient documentation

## 2014-03-18 DIAGNOSIS — Z79899 Other long term (current) drug therapy: Secondary | ICD-10-CM | POA: Insufficient documentation

## 2014-03-18 DIAGNOSIS — E7800 Pure hypercholesterolemia, unspecified: Secondary | ICD-10-CM | POA: Insufficient documentation

## 2014-03-18 DIAGNOSIS — I10 Essential (primary) hypertension: Secondary | ICD-10-CM | POA: Insufficient documentation

## 2014-03-18 DIAGNOSIS — C61 Malignant neoplasm of prostate: Secondary | ICD-10-CM | POA: Insufficient documentation

## 2014-03-18 HISTORY — DX: Hyperlipidemia, unspecified: E78.5

## 2014-03-18 HISTORY — PX: RADIOACTIVE SEED IMPLANT: SHX5150

## 2014-03-18 HISTORY — DX: Personal history of other diseases of the respiratory system: Z87.09

## 2014-03-18 SURGERY — INSERTION, RADIATION SOURCE, PROSTATE
Anesthesia: General | Site: Prostate

## 2014-03-18 MED ORDER — KETOROLAC TROMETHAMINE 30 MG/ML IJ SOLN
15.0000 mg | Freq: Once | INTRAMUSCULAR | Status: DC | PRN
  Filled 2014-03-18: qty 1

## 2014-03-18 MED ORDER — DEXAMETHASONE SODIUM PHOSPHATE 4 MG/ML IJ SOLN
INTRAMUSCULAR | Status: DC | PRN
  Administered 2014-03-18: 10 mg via INTRAVENOUS

## 2014-03-18 MED ORDER — ACETAMINOPHEN 10 MG/ML IV SOLN
INTRAVENOUS | Status: DC | PRN
  Administered 2014-03-18: 1000 mg via INTRAVENOUS

## 2014-03-18 MED ORDER — FENTANYL CITRATE 0.05 MG/ML IJ SOLN
25.0000 ug | INTRAMUSCULAR | Status: DC | PRN
  Filled 2014-03-18: qty 1

## 2014-03-18 MED ORDER — OXYCODONE HCL 5 MG PO TABS
ORAL_TABLET | ORAL | Status: AC
  Filled 2014-03-18: qty 1

## 2014-03-18 MED ORDER — GLYCOPYRROLATE 0.2 MG/ML IJ SOLN
INTRAMUSCULAR | Status: DC | PRN
  Administered 2014-03-18: 0.2 mg via INTRAVENOUS
  Administered 2014-03-18: 0.6 mg via INTRAVENOUS

## 2014-03-18 MED ORDER — EPHEDRINE SULFATE 50 MG/ML IJ SOLN
INTRAMUSCULAR | Status: DC | PRN
  Administered 2014-03-18 (×2): 10 mg via INTRAVENOUS

## 2014-03-18 MED ORDER — LACTATED RINGERS IV SOLN
INTRAVENOUS | Status: DC
  Administered 2014-03-18 (×3): via INTRAVENOUS
  Filled 2014-03-18: qty 1000

## 2014-03-18 MED ORDER — ONDANSETRON HCL 4 MG/2ML IJ SOLN
INTRAMUSCULAR | Status: DC | PRN
  Administered 2014-03-18: 4 mg via INTRAVENOUS

## 2014-03-18 MED ORDER — BELLADONNA ALKALOIDS-OPIUM 16.2-60 MG RE SUPP
RECTAL | Status: DC | PRN
  Administered 2014-03-18: 1 via RECTAL

## 2014-03-18 MED ORDER — FENTANYL CITRATE 0.05 MG/ML IJ SOLN
INTRAMUSCULAR | Status: DC | PRN
  Administered 2014-03-18 (×4): 25 ug via INTRAVENOUS
  Administered 2014-03-18: 100 ug via INTRAVENOUS

## 2014-03-18 MED ORDER — HYDROCODONE-ACETAMINOPHEN 5-325 MG PO TABS: 1.0000 | ORAL_TABLET | Freq: Four times a day (QID) | ORAL | Status: DC | PRN

## 2014-03-18 MED ORDER — BELLADONNA ALKALOIDS-OPIUM 16.2-60 MG RE SUPP
RECTAL | Status: AC
  Filled 2014-03-18: qty 1

## 2014-03-18 MED ORDER — MIDAZOLAM HCL 5 MG/5ML IJ SOLN
INTRAMUSCULAR | Status: DC | PRN
  Administered 2014-03-18: 2 mg via INTRAVENOUS

## 2014-03-18 MED ORDER — CIPROFLOXACIN HCL 500 MG PO TABS: 500.0000 mg | ORAL_TABLET | Freq: Two times a day (BID) | ORAL | Status: DC

## 2014-03-18 MED ORDER — OXYCODONE HCL 5 MG PO TABS
5.0000 mg | ORAL_TABLET | ORAL | Status: DC | PRN
  Administered 2014-03-18: 5 mg via ORAL
  Filled 2014-03-18: qty 1

## 2014-03-18 MED ORDER — KETOROLAC TROMETHAMINE 30 MG/ML IJ SOLN
INTRAMUSCULAR | Status: DC | PRN
  Administered 2014-03-18: 30 mg via INTRAVENOUS

## 2014-03-18 MED ORDER — MIDAZOLAM HCL 2 MG/2ML IJ SOLN
INTRAMUSCULAR | Status: AC
  Filled 2014-03-18: qty 2

## 2014-03-18 MED ORDER — NEOSTIGMINE METHYLSULFATE 1 MG/ML IJ SOLN
INTRAMUSCULAR | Status: DC | PRN
  Administered 2014-03-18: 4 mg via INTRAVENOUS

## 2014-03-18 MED ORDER — FLEET ENEMA 7-19 GM/118ML RE ENEM
1.0000 | ENEMA | Freq: Once | RECTAL | Status: DC
  Filled 2014-03-18: qty 1

## 2014-03-18 MED ORDER — IOHEXOL 350 MG/ML SOLN
INTRAVENOUS | Status: DC | PRN
  Administered 2014-03-18: 7 mL

## 2014-03-18 MED ORDER — STERILE WATER FOR IRRIGATION IR SOLN
Status: DC | PRN
  Administered 2014-03-18: 500 mL

## 2014-03-18 MED ORDER — LIDOCAINE HCL 2 % EX GEL
CUTANEOUS | Status: DC | PRN
  Administered 2014-03-18: 1

## 2014-03-18 MED ORDER — CIPROFLOXACIN IN D5W 400 MG/200ML IV SOLN
400.0000 mg | INTRAVENOUS | Status: AC
  Administered 2014-03-18 (×2): 200 mg via INTRAVENOUS
  Filled 2014-03-18: qty 200

## 2014-03-18 MED ORDER — LIDOCAINE HCL (CARDIAC) 20 MG/ML IV SOLN
INTRAVENOUS | Status: DC | PRN
  Administered 2014-03-18: 100 mg via INTRAVENOUS

## 2014-03-18 MED ORDER — ROCURONIUM BROMIDE 100 MG/10ML IV SOLN
INTRAVENOUS | Status: DC | PRN
  Administered 2014-03-18: 30 mg via INTRAVENOUS
  Administered 2014-03-18 (×2): 10 mg via INTRAVENOUS

## 2014-03-18 MED ORDER — PROPOFOL 10 MG/ML IV BOLUS
INTRAVENOUS | Status: DC | PRN
  Administered 2014-03-18: 200 mg via INTRAVENOUS

## 2014-03-18 MED ORDER — FENTANYL CITRATE 0.05 MG/ML IJ SOLN
INTRAMUSCULAR | Status: AC
  Filled 2014-03-18: qty 4

## 2014-03-18 MED ORDER — SUCCINYLCHOLINE CHLORIDE 20 MG/ML IJ SOLN
INTRAMUSCULAR | Status: DC | PRN
  Administered 2014-03-18: 120 mg via INTRAVENOUS

## 2014-03-18 MED ORDER — PROMETHAZINE HCL 25 MG/ML IJ SOLN
6.2500 mg | INTRAMUSCULAR | Status: DC | PRN
  Filled 2014-03-18: qty 1

## 2014-03-18 SURGICAL SUPPLY — 24 items
BAG URINE DRAINAGE (UROLOGICAL SUPPLIES) ×2 IMPLANT
BLADE SURG ROTATE 9660 (MISCELLANEOUS) ×2 IMPLANT
CATH FOLEY 2WAY SLVR  5CC 16FR (CATHETERS) ×2
CATH FOLEY 2WAY SLVR 5CC 16FR (CATHETERS) ×2 IMPLANT
CATH ROBINSON RED A/P 20FR (CATHETERS) ×2 IMPLANT
CLOTH BEACON ORANGE TIMEOUT ST (SAFETY) ×2 IMPLANT
COVER MAYO STAND STRL (DRAPES) ×2 IMPLANT
COVER TABLE BACK 60X90 (DRAPES) ×2 IMPLANT
DRSG TEGADERM 4X4.75 (GAUZE/BANDAGES/DRESSINGS) ×2 IMPLANT
DRSG TEGADERM 8X12 (GAUZE/BANDAGES/DRESSINGS) ×2 IMPLANT
GLOVE BIO SURGEON STRL SZ7.5 (GLOVE) ×7 IMPLANT
GLOVE ECLIPSE 8.0 STRL XLNG CF (GLOVE) IMPLANT
GOWN STRL REIN XL XLG (GOWN DISPOSABLE) ×1 IMPLANT
GOWN STRL REUS W/ TWL LRG LVL3 (GOWN DISPOSABLE) IMPLANT
GOWN STRL REUS W/ TWL XL LVL3 (GOWN DISPOSABLE) IMPLANT
GOWN STRL REUS W/TWL LRG LVL3 (GOWN DISPOSABLE) ×2
GOWN STRL REUS W/TWL XL LVL3 (GOWN DISPOSABLE) ×2
HOLDER FOLEY CATH W/STRAP (MISCELLANEOUS) ×2 IMPLANT
PACK CYSTOSCOPY (CUSTOM PROCEDURE TRAY) ×2 IMPLANT
SPONGE GAUZE 4X4 12PLY STER LF (GAUZE/BANDAGES/DRESSINGS) ×1 IMPLANT
SYRINGE 10CC LL (SYRINGE) ×2 IMPLANT
UNDERPAD 30X30 INCONTINENT (UNDERPADS AND DIAPERS) ×4 IMPLANT
WATER STERILE IRR 500ML POUR (IV SOLUTION) ×2 IMPLANT
radioactive seed ×68 IMPLANT

## 2014-03-18 NOTE — Progress Notes (Signed)
   Radiation Oncology         [phone]) [phone] ________________________________  Name: William Combs MRN: [id number]  Date: 02/05/2014  DOB: 24-Dec-1945  Prostate Seed Implant  HQ:IONGE,[id number] NEVILL, MD  Dr. Rana Snare DIAGNOSIS: A 69 year old gentlemen with stage T2a adenocarcinoma of the prostate with a Gleason of 6 and a PSA of 4.62.  PROCEDURE: Insertion of radioactive I-125 seeds into the prostate gland.  RADIATION DOSE: 145 Gy, definitive therapy.  TECHNIQUE: Jabori Henegar was brought to the operating room with the urologist. He was placed in the dorsolithotomy position. He was catheterized and a rectal tube was inserted. The perineum was shaved, prepped and draped. The ultrasound probe was then introduced into the rectum to see the prostate gland.  TREATMENT DEVICE: A needle grid was attached to the ultrasound probe stand and anchor needles were placed.  3D PLANNING: The prostate was imaged in 3D using a sagittal sweep of the prostate probe. These images were transferred to the planning computer. There, the prostate, urethra and rectum were defined on each axial reconstructed image. Then, the software created an optimized 3D plan and a few seed positions were adjusted. The quality of the plan was reviewed using [facility] information for the target and the following two organs at risk:  Urethra and Rectum.  Then the accepted plan was uploaded to the seed Selectron afterloading unit.  PROSTATE VOLUME STUDY:  Using transrectal ultrasound the volume of the prostate was verified to be 47.3 cc.  SPECIAL TREATMENT PROCEDURE/SUPERVISION AND HANDLING: The Nucletron FIRST system was used to place the needles under sagittal guidance. A total of 30 needles were used to deposit 68 seeds in the prostate gland. The individual seed activity was 0.51 mCi for a total implant activity of 34.6 mCi.  COMPLEX SIMULATION: At the end of the procedure, an anterior radiograph of the pelvis was obtained to  document seed positioning and count. Cystoscopy was performed to check the urethra and bladder.  MICRODOSIMETRY: At the end of the procedure, the patient was emitting 0.09 mrem/hr at 1 meter. Accordingly, he was considered safe for hospital discharge.  PLAN: The patient will return to the radiation oncology clinic for post implant CT dosimetry in three weeks.  ------------------------------------------------        Rexene Edison, MD

## 2014-03-18 NOTE — Anesthesia Procedure Notes (Addendum)
Procedure Name: Intubation Date/Time: 03/18/2014 8:38 AM Performed by: Mechele Claude Pre-anesthesia Checklist: Patient identified, Emergency Drugs available, Suction available and Patient being monitored Patient Re-evaluated:Patient Re-evaluated prior to inductionOxygen Delivery Method: Circle System Utilized Preoxygenation: Pre-oxygenation with 100% oxygen Intubation Type: IV induction Ventilation: Mask ventilation without difficulty Laryngoscope Size: Mac and 4 Grade View: Grade II Tube type: Oral Tube size: 7.5 mm Number of attempts: 1 Airway Equipment and Method: stylet and oral airway Placement Confirmation: ETT inserted through vocal cords under direct vision,  positive ETCO2 and breath sounds checked- equal and bilateral Secured at: 21 cm Tube secured with: Tape Dental Injury: Teeth and Oropharynx as per pre-operative assessment

## 2014-03-18 NOTE — Transfer of Care (Signed)
Immediate Anesthesia Transfer of Care Note  Patient: William Combs  Procedure(s) Performed: Procedure(s) (LRB): RADIOACTIVE SEED IMPLANT (N/A)  Patient Location: PACU  Anesthesia Type: General  Level of Consciousness: awake, alert  and oriented  Airway & Oxygen Therapy: Patient Spontanous Breathing and Patient connected to face mask oxygen  Post-op Assessment: Report given to PACU RN and Post -op Vital signs reviewed and stable  Post vital signs: Reviewed and stable  Complications: No apparent anesthesia complications

## 2014-03-18 NOTE — Discharge Instructions (Addendum)
DISCHARGE INSTRUCTIONS FOR PROSTATE SEED IMPLANTATION ° °Removal of catheter °Remove the foley catheter after 24 hours ( day after the procedure).can be done easily by cutting the side port of the catheter, whichallow the balloon to deflate.  You will see 1-2 teaspoons of clear water as the balloon deflates and then the catheter can be slid out without difficulty. ° ° °     Cut here ° °Antibiotics °You may be given a prescription for an antibiotic to take when you arrive home. If so, be sure to take every tablet in the bottle, even if you are feeling better before the prescription is finished. If you begin itching, notice a rash or start to swell on your trunk, arms, legs and/or throat, immediately stop taking the antibiotic and call your Urologist. °Diet °Resume your usual diet when you return home. To keep your bowels moving easily and softly, drink prune, apple and cranberry juice at room temperature. You may also take a stool softener, such as Colace, which is available without prescription at local pharmacies. °Daily activities °  No driving or heavy lifting for at least two days after the implant. °  No bike riding, horseback riding or riding lawn mowers for the first month after the implant. °  Any strenuous physical activity should be approved by your doctor before you resume it. °Sexual relations °You may resume sexual relations two weeks after the procedure. A condom should be used for the first two weeks. Your semen may be dark brown or black; this is normal and is related bleeding that may have occurred during the implant. °Postoperative swelling °Expect swelling and bruising of the scrotum and perineum (the area between the scrotum and anus). Both the swelling and the bruising should resolve in l or 2 weeks. Ice packs and over- the-counter medications such as Tylenol, Advil or Aleve may lessen your discomfort. °Postoperative urination °Most men experience burning on urination and/or urinary frequency.  If this becomes bothersome, contact your Urologist.  Medication can be prescribed to relieve these problems.  It is normal to have some blood in your urine for a few days after the implant. °Special instructions related to the seeds °It is unlikely that you will pass an Iodine-125 seed in your urine. The seeds are silver in color and are about as large as a grain of rice. If you pass a seed, do not handle it with your fingers. Use a spoon to place it in an envelope or jar in place this in base occluded area such as the garage or basement for return to the radiation clinic at your convenience. ° °Contact your doctor for °  Temperature greater than 101 F °  Increasing pain °  Inability to urinate °Follow-up ° You should have follow up with your urologist and radiation oncologist about 3 weeks after the procedure. °General information regarding Iodine seeds °  Iodine-125 is a low energy radioactive material. It is not deeply penetrating and loses energy at short distances. Your prostate will absorb the radiation. Objects that are touched or used by the patient do not become radioactive. °  Body wastes (urine and stool) or body fluids (saliva, tears, semen or blood) are not radioactive. °  The Nuclear Regulatory Commission (NRC) has determined that no radiation precautions are needed for patients undergoing Iodine-125 seed implantation. The NRC states that such patients do not present a risk to the people around them, including young children and pregnant women. However, in keeping with the general principle   that radiation exposure should be kept as low reasonably possible, we suggest the following: °  Children and pets should not sit on the patient's lap for the first two (2) weeks after the implant. °  Pregnant (or possibly pregnant) women should avoid prolonged, close contact with the patient for the first two (2) weeks after the implant. °  A distance of three (3) feet is acceptable. °  At a distance of three (3)  feet, there is no limit to the length of time anyone can be with the patient. ° ° °May restart aspirin in 3 days ° °Post Anesthesia Home Care Instructions ° °Activity: °Get plenty of rest for the remainder of the day. A responsible adult should stay with you for 24 hours following the procedure.  °For the next 24 hours, DO NOT: °-Drive a car °-Operate machinery °-Drink alcoholic beverages °-Take any medication unless instructed by your physician °-Make any legal decisions or sign important papers. ° °Meals: °Start with liquid foods such as gelatin or soup. Progress to regular foods as tolerated. Avoid greasy, spicy, heavy foods. If nausea and/or vomiting occur, drink only clear liquids until the nausea and/or vomiting subsides. Call your physician if vomiting continues. ° °Special Instructions/Symptoms: °Your throat may feel dry or sore from the anesthesia or the breathing tube placed in your throat during surgery. If this causes discomfort, gargle with warm salt water. The discomfort should disappear within 24 hours. ° °

## 2014-03-18 NOTE — Progress Notes (Signed)
CC: Dr. Enid Baas, Dr. 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 Rana Snare  Intent: Curative  Implant date: 03/18/2014  Site/dose: Prostate 14,500 cGy, isotope I-125 implant seen 68 seeds in 30 needles. Individual seed activity 0.51 mCi per seed for a total implant activity of 34.6 mCi.  Narrative: After setting up the [place] system there was a battery failure and the back up unit was employed. The patient was then prepped and underwent a successful implant.  Plan: Followup visit with me in Dr. Risa Grill in 3 weeks. We will obtain a CT scan at that time for his post implant dosimetry.

## 2014-03-18 NOTE — H&P (Signed)
Reason for visit:  I-125 prostate seed implantation.   History of Present Illness    William Combs presents today to discuss the findings of his recent prostate ultrasound and biopsy. Rectal exam was unremarkable. PSA was mildly elevated at 4.62. Ultrasound revealed approximately 48 g prostate without any worrisome features. All left-sided biopsies were negative. 6 out of 6 biopsies were positive on the right. These all showed Gleason's 3+3 equal 6 cancer with 5-30% core involvement. He has had no obvious complications or problems status post his procedure.  He would be blast classified as a clinical stage TIc adenocarcinoma prostate favorable risk     IPSS 10           SHIM 24/25   Past Medical History Problems  1. History of Arthritis (V13.4) 2. History of Asthma (493.90) 3. History of hypercholesterolemia (V12.29) 4. History of hypertension (V12.59)  Surgical History Problems  1. History of Knee Surgery 2. History of Knee Surgery  Current Meds 1. Adult Aspirin Low Strength 81 MG TBDP;  Therapy: (Recorded:15May2014) to Recorded 2. Crestor 10 MG Oral Tablet;  Therapy: [id number] to Recorded 3. Multi-Day TABS;  Therapy: (Recorded:15May2014) to Recorded 4. Valsartan-Hydrochlorothiazide 160-25 MG Oral Tablet;  Therapy: [id number] to Recorded  Allergies Medication  1. No Known Drug Allergies  Family History Problems  1. Family history of Death In The Family Father : Father   70yrs 2. Family history of Death In The Family Mother : Mother   52yrs 64. Family history of Dementia : Mother 16. Family history of Family Health Status Number Of Children   1 son and 1 daughter 5. Family history of Parkinson's Disease : Father  Social History Problems  1. Alcohol Use   2 qd 2. Caffeine Use   2 qd 3. Marital History - Currently Married 4. Never A Smoker 5. Occupation:   retired 46. Denied: History of Tobacco Use  Review of Systems Genitourinary, constitutional,  skin, eye, otolaryngeal, hematologic/lymphatic, cardiovascular, pulmonary, endocrine, musculoskeletal, gastrointestinal, neurological and psychiatric system(s) were reviewed and pertinent findings if present are noted.  Genitourinary: urinary frequency, nocturia and urinary stream starts and stops.  Musculoskeletal: back pain and joint pain.    Vitals Vital Signs [Data Includes: Last 1 Day]  Blood Pressure: 136 / 84 Temperature: 98.5 F Heart Rate: 63   Physical Exam Constitutional: Well nourished and well developed . No acute distress.  ENT:. The ears and nose are normal in appearance.  Neck: The appearance of the neck is normal and no neck mass is present.  Pulmonary: No respiratory distress and normal respiratory rhythm and effort.  Cardiovascular: Heart rate and rhythm are normal . No peripheral edema.  Abdomen: The abdomen is soft and nontender. No masses are palpated. No CVA tenderness. No hernias are palpable. No hepatosplenomegaly noted.  Rectal: Rectal exam demonstrates normal sphincter tone, no tenderness and no masses. Estimated prostate size is 2+. The prostate has no nodularity and is not tender. The left seminal vesicle is nonpalpable. The right seminal vesicle is nonpalpable. The perineum is normal on inspection.  Genitourinary: Examination of the penis demonstrates no discharge, no masses, no lesions and a normal meatus. The scrotum is without lesions. The right epididymis is palpably normal and non-tender. The left epididymis is found to have a spermatocele, but non-tender. The right testis is non-tender and without masses. The left testis is non-tender and without masses.  Lymphatics: The femoral and inguinal nodes are not enlarged or tender.  Skin: Normal skin turgor, no  visible rash and no visible skin lesions.  Neuro/Psych:. Mood and affect are appropriate.      Assessment Assessed  1. Prostate cancer (185)  Plan Prostate cancer  1. Radiation Oncology Referral  Referral  Referral    Dr. Valere Dross  Status: Hold For - Appointment,Records  Requested for: 23Feb2015  Discussion/Summary    The patient was counseled about the natural history of prostate cancer and the standard treatment options that are available for prostate cancer. It was explained to him how his age and life expectancy, clinical stage, Gleason score, and PSA affect his prognosis, the decision to proceed with additional staging studies, as well as how that information influences recommended treatment strategies. We discussed the roles for active surveillance, radiation therapy, surgical therapy, androgen deprivation, as well as ablative therapy options for the treatment of prostate cancer as appropriate to his individual cancer situation. We discussed the risks and benefits of these options with regard to their impact on cancer control and also in terms of potential adverse events, complications, and impact on quiality of life particularly related to urinary, bowel, and sexual function. The patient was encouraged to ask questions throughout the discussion today and all questions were answered to his stated satisfaction. In addition, the patient was provided with and/or directed to appropriate resources and literature for further education about prostate cancer and treatment options.   We discussed surgical therapy for prostate cancer including the different available surgical approaches. We discussed, in detail, the risks and expectations of surgery with regard to cancer control, urinary control, and erectile function as well as the expected postoperative recovery process. The risks, potential complications/adverse events of radical prostatectomy as well as alternative options were explained to the patient.   45 minutes were spent in face to face consultation with patient today.    AmendmentMike is interested in proceeding with seed implantation. I do think this is an excellent choice for him and I'll  balance between active surveillance and robotic surgery.  His prostate again is 48 g and therefore he will need a pubic arch study to be sure he is a candidate for seed implantation. Also consultation with Dr. Arloa Koh

## 2014-03-18 NOTE — Op Note (Signed)
Preoperative diagnosis: Clinical stage TI C adenocarcinoma the prostate  Postoperative diagnosis: Same  Procedure: I-125 prostate seed implantation with Nucletron robotic implanter  Flex cystoscopy Surgeon: [name] M.D. , Arloa Koh, M.D.  Anesthesia: Gen.  Indications: Patient  was diagnosed with clinical stage TI C prostate cancer. We had extensive discussion with him about treatment options versus. He elected to proceed with seed implantation. He underwent consultation my office as well as with Dr. Arloa Koh. He appeared to understand the advantages disadvantages potential risks of this treatment option. Full informed consent has been obtained. The patient is had preoperative ciprofloxacin. PAS compression boots were placed.  Technique and findings: Patient was brought the operating room where he had successful induction of general anesthesia. He was placed in lithotomy position and prepped and draped in usual manner. Appropriate surgical timeout was performed. Radiation oncology department placed a transrectal ultrasound probe anchoring stand. Foley catheter with contrast in the balloon was inserted without difficulty. Anchoring needles were placed within the prostate. Real-time contouring of the urethra prostate and rectum were performed and the dosing parameters were established. Targeted dose was 145 gray. We then came to the operating suite suite for placement of the needles. A second timeout was performed. All needle passage was done with real-time transrectal ultrasound guidance with the sagittal plane. A total of 30 needles were placed. See implantation itself was done with the robotic implanter. 68 active seeds were implanted. A Foley catheter was removed and flexible cystoscopy failed to show any seeds outside the prostate. The Foley catheter was inserted which drained clear urine. The patient was brought to recovery room in stable condition.

## 2014-03-18 NOTE — Interval H&P Note (Signed)
History and Physical Interval Note:  03/18/2014 8:29 AM  William Combs  has presented today for surgery, with the diagnosis of PROSTATE CANCER  The various methods of treatment have been discussed with the patient and family. After consideration of risks, benefits and other options for treatment, the patient has consented to  Procedure(s): RADIOACTIVE SEED IMPLANT (N/A) as a surgical intervention .  The patient's history has been reviewed, patient examined, no change in status, stable for surgery.  I have reviewed the patient's chart and labs.  Questions were answered to the patient's satisfaction.     William Combs

## 2014-03-18 NOTE — Anesthesia Postprocedure Evaluation (Signed)
  Anesthesia Post-op Note  Patient: William Combs  Procedure(s) Performed: Procedure(s) (LRB): RADIOACTIVE SEED IMPLANT (N/A)  Patient Location: PACU  Anesthesia Type: General  Level of Consciousness: awake and alert   Airway and Oxygen Therapy: Patient Spontanous Breathing  Post-op Pain: mild  Post-op Assessment: Post-op Vital signs reviewed, Patient's Cardiovascular Status Stable, Respiratory Function Stable, Patent Airway and No signs of Nausea or vomiting  Last Vitals:  Filed Vitals:   03/18/14 1215  BP: 139/78  Pulse: 68  Temp:   Resp: 17    Post-op Vital Signs: stable   Complications: No apparent anesthesia complications

## 2014-03-19 ENCOUNTER — Encounter (HOSPITAL_BASED_OUTPATIENT_CLINIC_OR_DEPARTMENT_OTHER): Payer: Self-pay | Admitting: Urology

## 2014-03-19 ENCOUNTER — Encounter: Payer: Self-pay | Admitting: Radiation Oncology

## 2014-03-23 ENCOUNTER — Telehealth: Payer: Self-pay | Admitting: *Deleted

## 2014-03-23 NOTE — Telephone Encounter (Signed)
Received vm from pt inquiring if he may play doubles tennis today. Pt had seed implant on 03/18/14. Per Dr Valere Dross, pt may play today if he is feeling well enough. Left vm for pt w/Dr Murray's response. Left call back name and number should pt have questions.

## 2014-04-07 ENCOUNTER — Telehealth: Payer: Self-pay | Admitting: *Deleted

## 2014-04-07 NOTE — Telephone Encounter (Signed)
CALLED PATIENT TO REMIND OF APPTS. FOR 04-08-14, LVM FOR A RETURN CALL

## 2014-04-08 ENCOUNTER — Encounter: Payer: Self-pay | Admitting: Radiation Oncology

## 2014-04-08 ENCOUNTER — Ambulatory Visit
Admit: 2014-04-08 | Discharge: 2014-04-08 | Disposition: A | Discharge status: Home / Self Care | Payer: PRIVATE HEALTH INSURANCE | Attending: Radiation Oncology | Admitting: Radiation Oncology

## 2014-04-08 ENCOUNTER — Ambulatory Visit
Admission: RE | Admit: 2014-04-08 | Discharge: 2014-04-08 | Disposition: A | Discharge status: Home / Self Care | Payer: Medicare Other | Source: Ambulatory Visit | Attending: Radiation Oncology | Admitting: Radiation Oncology

## 2014-04-08 VITALS — BP 140/84 | HR 63 | Temp 98.9°F | Resp 20

## 2014-04-08 DIAGNOSIS — C61 Malignant neoplasm of prostate: Secondary | ICD-10-CM

## 2014-04-08 NOTE — Progress Notes (Signed)
Pt denies pain. He states he has appointment w/Dr Risa Grill tomorrow and will discuss whether to continue w/Flomax.

## 2014-04-08 NOTE — Progress Notes (Signed)
CC: Dr. Rana Snare, Dr. 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 Following his implant, he went into urinary retention after removal of his catheter. He kept his catheter over the weekend and improved after starting tamsulosin. Up until this past week he had a moderate amount of urinary frequency and urgency. He finished prescribed Flomax yesterday. He was up only once last night. He is to see Dr. Risa Grill tomorrow.  His CT scan today shows what appears to be an excellent seed distribution.  Physical examination: Alert and oriented. Filed Vitals:   04/08/14 1114  BP: 140/84  Pulse: 63  Temp: 98.9 F (37.2 C)  Resp: 20   He's not examined today.  Impression: Satisfactory progress. I told him that he should probably continue his tamsulosin for at least a few more months.  Plan: Visit with Dr. Risa Grill tomorrow. We'll move ahead with his post implant dosimetry and forward the results to Dr. Risa Grill. I asked Dr. Risa Grill keep me posted on his progress and PSA determinations.

## 2014-04-08 NOTE — Progress Notes (Signed)
Complex simulation note: The patient was taken to the CT simulator. His pelvis was scanned. The CT data set was sent to the planning system for contouring of his prostate and rectum. He'll then undergo 3-D simulation to assess the quality of his implant.

## 2014-04-15 ENCOUNTER — Encounter: Payer: Self-pay | Admitting: Radiation Oncology

## 2014-04-15 NOTE — Progress Notes (Signed)
CC: Dr. Rana Snare, Dr. Enid Baas  Post implant CT dosimetry/3-D simulation note:  The patient underwent post-implant dosimetry/3-D simulation to assess the quality of his prostate implant. Dose volume histograms were obtained for the prostate and rectum. His intraoperative prostate volume by ultrasound was 47.3 cc, and his postoperative prostate volume by CT is 48.0 cc, and excellent correlation. His prostate D 90 is 114.5% (goal 90%), and V100  96.2% (goal 80%), both excellent. Only 0.72 cc of rectum received the prescribed dose of 14,500 cGy. In summary, William Combs has excellent post implant dosimetry with a low risk for late rectal toxicity.

## 2014-05-25 ENCOUNTER — Inpatient Hospital Stay (HOSPITAL_COMMUNITY)
Admission: EM | Admit: 2014-05-25 | Discharge: 2014-05-28 | DRG: 309 | Disposition: A | Discharge status: Home / Self Care | Payer: Medicare Other | Attending: Cardiology | Admitting: Cardiology

## 2014-05-25 ENCOUNTER — Emergency Department (HOSPITAL_COMMUNITY): Payer: Medicare Other

## 2014-05-25 ENCOUNTER — Encounter (HOSPITAL_COMMUNITY): Payer: Self-pay | Admitting: Emergency Medicine

## 2014-05-25 DIAGNOSIS — N400 Enlarged prostate without lower urinary tract symptoms: Secondary | ICD-10-CM | POA: Diagnosis present

## 2014-05-25 DIAGNOSIS — I129 Hypertensive chronic kidney disease with stage 1 through stage 4 chronic kidney disease, or unspecified chronic kidney disease: Secondary | ICD-10-CM | POA: Diagnosis present

## 2014-05-25 DIAGNOSIS — A045 Campylobacter enteritis: Secondary | ICD-10-CM | POA: Diagnosis present

## 2014-05-25 DIAGNOSIS — I4891 Unspecified atrial fibrillation: Principal | ICD-10-CM | POA: Diagnosis present

## 2014-05-25 DIAGNOSIS — K5730 Diverticulosis of large intestine without perforation or abscess without bleeding: Secondary | ICD-10-CM | POA: Diagnosis present

## 2014-05-25 DIAGNOSIS — N179 Acute kidney failure, unspecified: Secondary | ICD-10-CM | POA: Diagnosis present

## 2014-05-25 DIAGNOSIS — K5289 Other specified noninfective gastroenteritis and colitis: Secondary | ICD-10-CM

## 2014-05-25 DIAGNOSIS — I10 Essential (primary) hypertension: Secondary | ICD-10-CM

## 2014-05-25 DIAGNOSIS — C61 Malignant neoplasm of prostate: Secondary | ICD-10-CM | POA: Diagnosis present

## 2014-05-25 DIAGNOSIS — Z8249 Family history of ischemic heart disease and other diseases of the circulatory system: Secondary | ICD-10-CM

## 2014-05-25 DIAGNOSIS — N189 Chronic kidney disease, unspecified: Secondary | ICD-10-CM | POA: Diagnosis present

## 2014-05-25 DIAGNOSIS — I4892 Unspecified atrial flutter: Secondary | ICD-10-CM | POA: Diagnosis present

## 2014-05-25 DIAGNOSIS — Z7982 Long term (current) use of aspirin: Secondary | ICD-10-CM

## 2014-05-25 DIAGNOSIS — K529 Noninfective gastroenteritis and colitis, unspecified: Secondary | ICD-10-CM

## 2014-05-25 DIAGNOSIS — E785 Hyperlipidemia, unspecified: Secondary | ICD-10-CM

## 2014-05-25 DIAGNOSIS — Z8546 Personal history of malignant neoplasm of prostate: Secondary | ICD-10-CM

## 2014-05-25 HISTORY — DX: Unspecified atrial fibrillation: I48.91

## 2014-05-25 HISTORY — DX: Campylobacter enteritis: A04.5

## 2014-05-25 LAB — CBC
HCT: 47.7 % (ref 39.0–52.0)
Hemoglobin: 16.2 g/dL (ref 13.0–17.0)
MCH: 32.8 pg (ref 26.0–34.0)
MCHC: 34 g/dL (ref 30.0–36.0)
MCV: 96.6 fL (ref 78.0–100.0)
PLATELETS: 188 10*3/uL (ref 150–400)
RBC: 4.94 MIL/uL (ref 4.22–5.81)
RDW: 13.3 % (ref 11.5–15.5)
WBC: 7.1 10*3/uL (ref 4.0–10.5)

## 2014-05-25 LAB — COMPREHENSIVE METABOLIC PANEL
ALK PHOS: 78 U/L (ref 39–117)
ALT: 16 U/L (ref 0–53)
AST: 19 U/L (ref 0–37)
Albumin: 3.2 g/dL — ABNORMAL LOW (ref 3.5–5.2)
BILIRUBIN TOTAL: 0.3 mg/dL (ref 0.3–1.2)
BUN: 28 mg/dL — ABNORMAL HIGH (ref 6–23)
CHLORIDE: 100 meq/L (ref 96–112)
CO2: 22 meq/L (ref 19–32)
Calcium: 9.8 mg/dL (ref 8.4–10.5)
Creatinine, Ser: 1.41 mg/dL — ABNORMAL HIGH (ref 0.50–1.35)
GFR calc Af Amer: 58 mL/min — ABNORMAL LOW (ref >90)
GFR, EST NON AFRICAN AMERICAN: 50 mL/min — AB (ref >90)
Glucose, Bld: 117 mg/dL — ABNORMAL HIGH (ref 70–99)
POTASSIUM: 4.1 meq/L (ref 3.7–5.3)
SODIUM: 139 meq/L (ref 137–147)
Total Protein: 7.2 g/dL (ref 6.0–8.3)

## 2014-05-25 LAB — I-STAT TROPONIN, ED: Troponin i, poc: 0.03 ng/mL (ref 0.00–0.08)

## 2014-05-25 LAB — MAGNESIUM: Magnesium: 2 mg/dL (ref 1.5–2.5)

## 2014-05-25 LAB — HEMOGLOBIN A1C
Hgb A1c MFr Bld: 5.8 % — ABNORMAL HIGH (ref <5.7)
Mean Plasma Glucose: 120 mg/dL — ABNORMAL HIGH (ref <117)

## 2014-05-25 LAB — T4, FREE: Free T4: 1.59 ng/dL (ref 0.80–1.80)

## 2014-05-25 LAB — TSH: TSH: 0.96 u[IU]/mL (ref 0.350–4.500)

## 2014-05-25 LAB — PROTIME-INR
INR: 0.98 (ref 0.00–1.49)
PROTHROMBIN TIME: 13 s (ref 11.6–15.2)

## 2014-05-25 MED ORDER — HYPROMELLOSE (GONIOSCOPIC) 2.5 % OP SOLN: 1.0000 [drp] | Freq: Every day | OPHTHALMIC | Status: DC

## 2014-05-25 MED ORDER — DILTIAZEM HCL 100 MG IV SOLR
5.0000 mg/h | Freq: Once | INTRAVENOUS | Status: AC
  Administered 2014-05-25: 5 mg/h via INTRAVENOUS

## 2014-05-25 MED ORDER — APIXABAN 5 MG PO TABS
5.0000 mg | ORAL_TABLET | Freq: Two times a day (BID) | ORAL | Status: DC
  Administered 2014-05-25 – 2014-05-28 (×6): 5 mg via ORAL
  Filled 2014-05-25 (×7): qty 1

## 2014-05-25 MED ORDER — ONDANSETRON HCL 4 MG/2ML IJ SOLN: 4.0000 mg | Freq: Four times a day (QID) | INTRAMUSCULAR | Status: DC | PRN

## 2014-05-25 MED ORDER — ACETAMINOPHEN 325 MG PO TABS: 650.0000 mg | ORAL_TABLET | ORAL | Status: DC | PRN

## 2014-05-25 MED ORDER — SODIUM CHLORIDE 0.9 % IV BOLUS (SEPSIS)
1000.0000 mL | Freq: Once | INTRAVENOUS | Status: AC
  Administered 2014-05-25: 1000 mL via INTRAVENOUS

## 2014-05-25 MED ORDER — ATORVASTATIN CALCIUM 10 MG PO TABS
10.0000 mg | ORAL_TABLET | Freq: Every day | ORAL | Status: DC
  Administered 2014-05-25 – 2014-05-27 (×3): 10 mg via ORAL
  Filled 2014-05-25 (×4): qty 1

## 2014-05-25 MED ORDER — DEXTROSE 5 % IV SOLN
10.0000 mg/h | Freq: Once | INTRAVENOUS | Status: AC
  Administered 2014-05-25: 10 mg/h via INTRAVENOUS

## 2014-05-25 MED ORDER — DILTIAZEM HCL 100 MG IV SOLR
5.0000 mg/h | INTRAVENOUS | Status: DC
  Administered 2014-05-25 – 2014-05-27 (×2): 5 mg/h via INTRAVENOUS
  Filled 2014-05-25 (×4): qty 100

## 2014-05-25 MED ORDER — ADULT MULTIVITAMIN W/MINERALS CH
1.0000 | ORAL_TABLET | Freq: Every evening | ORAL | Status: DC
  Administered 2014-05-26 – 2014-05-27 (×2): 1 via ORAL
  Filled 2014-05-25 (×4): qty 1

## 2014-05-25 MED ORDER — SODIUM CHLORIDE 0.9 % IV SOLN
INTRAVENOUS | Status: AC
  Administered 2014-05-25: 16:00:00 via INTRAVENOUS

## 2014-05-25 MED ORDER — DILTIAZEM HCL 25 MG/5ML IV SOLN
20.0000 mg | Freq: Once | INTRAVENOUS | Status: AC
  Administered 2014-05-25: 20 mg via INTRAVENOUS

## 2014-05-25 MED ORDER — POLYVINYL ALCOHOL 1.4 % OP SOLN
1.0000 [drp] | Freq: Every day | OPHTHALMIC | Status: DC
  Filled 2014-05-25: qty 15

## 2014-05-25 MED ORDER — DILTIAZEM HCL 25 MG/5ML IV SOLN
15.0000 mg | Freq: Once | INTRAVENOUS | Status: AC
  Administered 2014-05-25: 15 mg via INTRAVENOUS
  Filled 2014-05-25: qty 5

## 2014-05-25 NOTE — ED Notes (Signed)
Pt presents to department for evaluation of diarrhea and generalized weakness since Thursday. Went to PCP this morning, pt noted to be tachycardic, 155bpm upon arrival to ED. States she feels very weak and fatigued. Denies chest pain. Also states SOB on exertion. Pt is alert and oriented x4.

## 2014-05-25 NOTE — ED Notes (Addendum)
Pt's heart rate 95 at this time. Increase in Cardizem held at this time, Dr. Ashok Cordia made aware.

## 2014-05-25 NOTE — ED Provider Notes (Signed)
CSN: [id number]     Arrival date & time 05/25/14  1056 History   First MD Initiated Contact with Patient 05/25/14 1057     Chief Complaint  Patient presents with  . Tachycardia  . Diarrhea     (Consider location/radiation/quality/duration/timing/severity/associated sxs/prior Treatment) Patient is a 68 y.o. male presenting with diarrhea.  Diarrhea Quality:  Watery Severity:  Mild Onset quality:  Gradual Duration:  5 days Timing:  Constant Relieved by:  None tried Worsened by:  Nothing tried Ineffective treatments:  None tried Associated symptoms: no abdominal pain, no chills, no recent cough, no fever, no headaches and no vomiting   Risk factors: no recent antibiotic use, no sick contacts and no travel to endemic areas     Past Medical History  Diagnosis Date  . Hypertension   . BPH (benign prostatic hyperplasia)   . Hyperlipidemia   . Prostate cancer DX  12/16/13    gleason 3+3=6, volume 48 gm, s/p seed implant 02/2014.  Marland Kitchen Arthritis     JOINTS  . History of asthma     EXERCISE INDUCED ASTHMA  1996 FOR 6 MONTHS   Past Surgical History  Procedure Laterality Date  . Knee arthroscopy  09/25/2012    Procedure: ARTHROSCOPY KNEE;  Surgeon: Gearlean Alf, MD;  Location: [facility];  Service: Orthopedics;  Laterality: Left;  with medial and lateral meniscus Debridement  . Prostate biopsy  12/16/13    Gleason 6, volume 48 gm  . Right knee surgery  1975  . Radioactive seed implant N/A 03/18/2014    Procedure: RADIOACTIVE SEED IMPLANT;  Surgeon: [name], MD;  Location: [facility];  Service: Urology;  Laterality: N/A;   Family History  Problem Relation Age of Onset  . Dementia Mother   . Parkinson's disease Father   . Heart disease Brother     Older brother had some sort of rhythm procedure, is now active afterwards   History  Substance Use Topics  . Smoking status: Never Smoker   . Smokeless tobacco: Never Used  . Alcohol Use: 7.0 oz/week    14 drink(s)  per week     Comment: 2-3 DRINKS DAILY (standard size)    Review of Systems  Constitutional: Positive for activity change and fatigue. Negative for fever and chills.  HENT: Negative for congestion and rhinorrhea.   Eyes: Negative for pain.  Respiratory: Negative for cough and shortness of breath.   Cardiovascular: Negative for chest pain and palpitations.  Gastrointestinal: Positive for diarrhea. Negative for vomiting, abdominal pain and constipation.  Endocrine: Negative for polydipsia and polyuria.  Genitourinary: Negative for dysuria and flank pain.  Musculoskeletal: Negative for back pain and neck pain.  Skin: Negative for color change and wound.  Neurological: Negative for dizziness, numbness and headaches.      Allergies  Review of patient's allergies indicates no known allergies.  Home Medications   Prior to Admission medications   Medication Sig Start Date End Date Taking? Authorizing Provider  aspirin EC 81 MG tablet Take 81 mg by mouth every evening.   Yes Historical Provider, MD  hydroxypropyl methylcellulose (ISOPTO TEARS) 2.5 % ophthalmic solution Place 1 drop into both eyes daily.   Yes Historical Provider, MD  Multiple Vitamin (MULTIVITAMIN WITH MINERALS) TABS Take 1 tablet by mouth every evening.    Yes Historical Provider, MD  rosuvastatin (CRESTOR) 10 MG tablet Take 5 mg by mouth every evening.   Yes Historical Provider, MD  valsartan-hydrochlorothiazide (DIOVAN-HCT) 160-25 MG  per tablet Take 0.5 tablets by mouth every evening.   Yes Historical Provider, MD   BP 106/65  Pulse 81  Temp(Src) 98.6 F (37 C) (Oral)  Resp 21  SpO2 93% Physical Exam  Nursing note and vitals reviewed. Constitutional: He is oriented to person, place, and time. He appears well-developed and well-nourished.  HENT:  Head: Normocephalic and atraumatic.  Eyes: Conjunctivae and EOM are normal. Pupils are equal, round, and reactive to light.  Neck: Normal range of motion.   Cardiovascular: Regular rhythm.  Tachycardia present.   Pulmonary/Chest: Effort normal and breath sounds normal.  Abdominal: Soft. He exhibits no distension. There is no tenderness.  Musculoskeletal: Normal range of motion. He exhibits no edema and no tenderness.  Neurological: He is alert and oriented to person, place, and time.  Skin: Skin is warm and dry.    ED Course  Procedures (including critical care time) Labs Review Labs Reviewed  COMPREHENSIVE METABOLIC PANEL - Abnormal; Notable for the following:    Glucose, Bld 117 (*)    BUN 28 (*)    Creatinine, Ser 1.41 (*)    Albumin 3.2 (*)    GFR calc non Af Amer 50 (*)    GFR calc Af Amer 58 (*)    All other components within normal limits  CBC  I-STAT TROPOININ, ED    Imaging Review Dg Chest 2 View  05/25/2014   CLINICAL DATA:  Hypertension and tachycardia.  EXAM: CHEST  2 VIEW  COMPARISON:  02/17/2014.  FINDINGS: The cardiac silhouette, mediastinal and hilar contours are within normal limits and stable. The lungs are clear. No pleural effusion. The bony thorax is intact.  IMPRESSION: No acute cardiopulmonary findings.   Electronically Signed   By: Kalman Jewels M.D.   On: 05/25/2014 14:28     EKG Interpretation   Date/Time:  Monday May 25 2014 10:59:28 EDT Ventricular Rate:  151 PR Interval:    QRS Duration: 91 QT Interval:  353 QTC Calculation: 559 R Axis:   78 Text Interpretation:  Atrial flutter with predominant 2:1 AV block  Non-specific ST-t changes Confirmed by Ashok Cordia  MD, Lennette Bihari ([id number]) on  05/25/2014 11:26:35 AM      MDM   Final diagnoses:  Atrial fibrillation with RVR  Acute gastroenteritis    68 yo M w/ h/o HTN and HLD, prostate CA w/ radiation seeding currently here from PCP for tachycardia.  Patient with non bloody diarrhea for 4-5 days with associated fatigue and weakness. No fever, nausea or dyspnea. Went to PCP, HR in 150's but otherwise stable. Sent here for eval.  Here tachycardia with  borderline BP's, no nausea, crackles, le edema.  ecg with likely aflutter w/ 2:1 block. dilt bolus and drip given and after second dose and infusion change patient HR improved to 80's still appears to be aflutter with a variable block. Labs normal, ecg normal. Cardiology consulted for recs and patient admitted to their service.     Merrily Pew, MD 05/25/14 1531

## 2014-05-25 NOTE — H&P (Signed)
History and Physical  Patient ID: William Combs MRN: [id number], DOB: 1946/05/18 Date of Encounter: 05/25/2014, 1:46 PM Primary Physician: Henrine Screws, MD Primary Cardiologist: New to Dr. Aundra Dubin  Chief Complaint: diarrhea, weakness Reason for Admission: atrial flutter/fib, newly recognized  HPI: William Combs is a 68 y/o M with no prior cardiac history but history of HTN, HL, prostate CA s/p seed implant 02/2014 who presented initially to his PCP today for evaluation of diarrhea and weakness, found to be in new onset atrial flutter. He is regularly active and participates in tennis, golf, and exercising at the gym several times a week without any recent anginal symptoms. He played tennis without event on Wednesday. On Thursday he went to the gym as usual but later that evening began having chills, subjective fever, diarrhea, and poor appetite. He is having 10-12 episodes of watery nonbloody diarrhea daily. This persisted through the weekend so he went to his PCP's office today where he was found to be tachycardic in newly recognized atrial flutter HR 150's so was sent to the ER. BP was on softer side in the low 144R systolic. He received 1L NS bolus, a 15mg  dilt bolus then drip, degenerated into atrial fibrillation with HR in the 80s, then a recurrence of flutter to 150, so received a 2nd 20mg  diltiazem bolus again with conversion to atrial fibrillation where he currently remains. Labs significant for BUN 28/Cr 1.41, glu 117, troponin neg x 1. He was unaware that he was in atrial fib/flutter therefore true duration is unknown. He denies any CP, LEE, significant SOB, or syncope.   Past Medical History  Diagnosis Date  . Hypertension   . BPH (benign prostatic hyperplasia)   . Hyperlipidemia   . Prostate cancer DX  12/16/13    gleason 3+3=6, volume 48 gm, s/p seed implant 02/2014.  Marland Kitchen Arthritis     JOINTS  . History of asthma     EXERCISE INDUCED ASTHMA  1996 FOR 6 MONTHS     Most Recent  Cardiac Studies: None   Surgical History:  Past Surgical History  Procedure Laterality Date  . Knee arthroscopy  09/25/2012    Procedure: ARTHROSCOPY KNEE;  Surgeon: Gearlean Alf, MD;  Location: [facility];  Service: Orthopedics;  Laterality: Left;  with medial and lateral meniscus Debridement  . Prostate biopsy  12/16/13    Gleason 6, volume 48 gm  . Right knee surgery  1975  . Radioactive seed implant N/A 03/18/2014    Procedure: RADIOACTIVE SEED IMPLANT;  Surgeon: [name], MD;  Location[facility];  Service: Urology;  Laterality: N/A;     Home Meds: Prior to Admission medications   Medication Sig Start Date End Date Taking? Authorizing Chrishawn Kring  aspirin EC 81 MG tablet Take 81 mg by mouth every evening.   Yes Historical Addam Goeller, MD  hydroxypropyl methylcellulose (ISOPTO TEARS) 2.5 % ophthalmic solution Place 1 drop into both eyes daily.   Yes Historical Juan Olthoff, MD  Multiple Vitamin (MULTIVITAMIN WITH MINERALS) TABS Take 1 tablet by mouth every evening.    Yes Historical Montana Bryngelson, MD  rosuvastatin (CRESTOR) 10 MG tablet Take 5 mg by mouth every evening.   Yes Historical Leodan Bolyard, MD  valsartan-hydrochlorothiazide (DIOVAN-HCT) 160-25 MG per tablet Take 0.5 tablets by mouth every evening.   Yes Historical Mckynleigh Mussell, MD    Allergies: No Known Allergies  History   Social History  . Marital Status: Married    Spouse Name: N/A    Number of  Children: N/A  . Years of Education: N/A   Occupational History  . 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 Smoking status: Never Smoker   . Smokeless tobacco: Never Used  . Alcohol Use: 7.0 oz/week    14 drink(s) per week     Comment: 2-3 DRINKS DAILY (standard size)  . Drug Use: No  . Sexual Activity: Not on file   Other Topics Concern  . Not on file   Social History Narrative  . No narrative on file     Family History  Problem Relation Age of Onset  .  Dementia Mother   . Parkinson's disease Father   . Heart disease Brother     Older brother had some sort of rhythm procedure, is now active afterwards    Review of Systems: General: see above. Pt thinks he may have lost a few lbs due to poor appetite over the last few days. Cardiovascular: see above  Dermatological: negative for rash Respiratory: negative for cough or wheezing Abdominal: negative for diarrhea, bright red blood per rectum, melena, or hematemesis. While eating dinner yesterday felt if he ate more that he might vomit.  Neurologic: negative for syncope All other systems reviewed and are otherwise negative except as noted above.  Labs:   Lab Results  Component Value Date   WBC 7.1 05/25/2014   HGB 16.2 05/25/2014   HCT 47.7 05/25/2014   MCV 96.6 05/25/2014   PLT 188 05/25/2014     Recent Labs Lab 05/25/14 1116  NA 139  K 4.1  CL 100  CO2 22  BUN 28*  CREATININE 1.41*  CALCIUM 9.8  PROT 7.2  BILITOT 0.3  ALKPHOS 78  ALT 16  AST 19  GLUCOSE 117*   Radiology/Studies:  No results found.   EKG:  6/29 10:59am: atrial flutter 2:1 block 151bpm, nonspecific ST-T changes  6/29 11:45am: coarse atrial fibrillation 100bpm, nonspecific ST-T changes ? ST depression inferolaterally  The following information was typed in error and I could not delete it: "Review of Systems  All other systems reviewed and are negative. "  Physical Exam: Blood pressure 113/69, pulse 60, temperature 98.6 F (37 C), temperature source Oral, resp. rate 12, SpO2 100.00%. General: Well developed, well nourished WM in no acute distress. Comfortable appearing Head: Normocephalic, atraumatic, sclera non-icteric, no xanthomas, nares are without discharge.  Neck: Negative for carotid bruits. JVD not elevated. Lungs: Mild crackles L base. Otherwise clear bilaterally to auscultation without wheezes, rales, or rhonchi. Breathing is unlabored. Heart: Irregular rhythm, rate controlled, with S1 S2.  No murmurs, rubs, or gallops appreciated. Abdomen: Soft, non-tender, non-distended with normoactive bowel sounds. No hepatomegaly. No rebound/guarding. No obvious abdominal masses. Msk:  Strength and tone appear normal for age. Extremities: No clubbing or cyanosis. No edema.  Distal pedal pulses are 2+ and equal bilaterally. Neuro: Alert and oriented X 3. No focal deficit. No facial asymmetry. Moves all extremities spontaneously. Psych:  Responds to questions appropriately with a normal affect.    ASSESSMENT AND PLAN:  1. Atrial arrhythmias - newly dx atrial flutter/fib 2. Acute gastroenteritis 3. Acute kidney injury, suspect related to dehydration from #2  Patient seen and examined in conjunction with Dr. Aundra Dubin. See below for comprehensive thoughts. Will hold diovan-HCT to give a little more room for BP for AV nodal blocking agents (also with kidney function).  Signed, Lisbeth Renshaw Dunn PA-C 05/25/2014, 1:46 PM  Patient seen with PA, agree with the above note.  Patient presented with fatigue/weakness and watery diarrhea for several days.  Went to PCP, found to be in atrial flutter with HR 150, sent to ER.  In the ER, he converted into atrial fibrillation with HR in 80s where he remains currently.  1. Atrial arrhythmia: Initially atrial flutter, then degenerated into atrial fibrillation in the ER.  Currently in atrial fibrillation, HR 80s on diltiazem gtt.  Hard to tell when this started, as his only symptom is "feeling weak," and he has had this since he developed acute gastroenteritis several days ago.  Suspect the stress of acute illness triggered the atrial arrhythmias but he likely has an underlying predisposition.  CHADSVASC = 2 (age, HTN). - Continue diltiazem gtt - Start Eliquis 5 mg bid.  - If remains in atrial fibrillation overnight, probably should plan on cardioversion given 1st episode. Depending on his symptoms, could proceed one of two ways: if he is minimally symptomatic, could send  home on Eliquis for 3+ weeks then cardiovert without TEE; alternatively, could do TEE-DCCV after 5+ doses of Eliquis (could be done on Thursday => could still go home and come back for this as an outpatient).  - Echo  - TSH 2. Acute gastroenteritis: Suspect with profuse watery diarrhea.  Will check for stool pathogens but suspect we will be treating symptomatically.  Will give gentle IV fluid rehydration given mild AKI.   [name] MD 05/25/2014 1:46 PM

## 2014-05-25 NOTE — ED Notes (Signed)
Pt's heart rate back to 150-153. EDP made aware

## 2014-05-25 NOTE — ED Notes (Signed)
Cardiology at bedside.

## 2014-05-26 DIAGNOSIS — K529 Noninfective gastroenteritis and colitis, unspecified: Secondary | ICD-10-CM | POA: Diagnosis present

## 2014-05-26 DIAGNOSIS — I517 Cardiomegaly: Secondary | ICD-10-CM

## 2014-05-26 DIAGNOSIS — K5730 Diverticulosis of large intestine without perforation or abscess without bleeding: Secondary | ICD-10-CM | POA: Diagnosis present

## 2014-05-26 LAB — CBC WITH DIFFERENTIAL/PLATELET
BASOS PCT: 1 % (ref 0–1)
Basophils Absolute: 0.1 10*3/uL (ref 0.0–0.1)
EOS ABS: 0.1 10*3/uL (ref 0.0–0.7)
EOS PCT: 1 % (ref 0–5)
HEMATOCRIT: 43.1 % (ref 39.0–52.0)
HEMOGLOBIN: 14.7 g/dL (ref 13.0–17.0)
Lymphocytes Relative: 24 % (ref 12–46)
Lymphs Abs: 2 10*3/uL (ref 0.7–4.0)
MCH: 32.7 pg (ref 26.0–34.0)
MCHC: 34.1 g/dL (ref 30.0–36.0)
MCV: 96 fL (ref 78.0–100.0)
MONOS PCT: 13 % — AB (ref 3–12)
Monocytes Absolute: 1.1 10*3/uL — ABNORMAL HIGH (ref 0.1–1.0)
NEUTROS PCT: 61 % (ref 43–77)
Neutro Abs: 4.9 10*3/uL (ref 1.7–7.7)
Platelets: 196 10*3/uL (ref 150–400)
RBC: 4.49 MIL/uL (ref 4.22–5.81)
RDW: 13.1 % (ref 11.5–15.5)
WBC: 8.2 10*3/uL (ref 4.0–10.5)

## 2014-05-26 LAB — CBC
HCT: 39.5 % (ref 39.0–52.0)
Hemoglobin: 13.2 g/dL (ref 13.0–17.0)
MCH: 32 pg (ref 26.0–34.0)
MCHC: 33.4 g/dL (ref 30.0–36.0)
MCV: 95.9 fL (ref 78.0–100.0)
Platelets: 189 10*3/uL (ref 150–400)
RBC: 4.12 MIL/uL — AB (ref 4.22–5.81)
RDW: 13.1 % (ref 11.5–15.5)
WBC: 7.2 10*3/uL (ref 4.0–10.5)

## 2014-05-26 LAB — BASIC METABOLIC PANEL
BUN: 23 mg/dL (ref 6–23)
CALCIUM: 8.8 mg/dL (ref 8.4–10.5)
CO2: 23 meq/L (ref 19–32)
CREATININE: 1.18 mg/dL (ref 0.50–1.35)
Chloride: 102 mEq/L (ref 96–112)
GFR calc Af Amer: 71 mL/min — ABNORMAL LOW (ref >90)
GFR, EST NON AFRICAN AMERICAN: 62 mL/min — AB (ref >90)
GLUCOSE: 100 mg/dL — AB (ref 70–99)
Potassium: 4.1 mEq/L (ref 3.7–5.3)
Sodium: 140 mEq/L (ref 137–147)

## 2014-05-26 LAB — LIPID PANEL
Cholesterol: 105 mg/dL (ref 0–200)
HDL: 39 mg/dL — ABNORMAL LOW (ref >39)
LDL CALC: 43 mg/dL (ref 0–99)
Total CHOL/HDL Ratio: 2.7 RATIO
Triglycerides: 117 mg/dL (ref <150)
VLDL: 23 mg/dL (ref 0–40)

## 2014-05-26 MED ORDER — SODIUM CHLORIDE 0.9 % IV SOLN: INTRAVENOUS | Status: AC

## 2014-05-26 MED ORDER — PANTOPRAZOLE SODIUM 40 MG PO TBEC
40.0000 mg | DELAYED_RELEASE_TABLET | Freq: Every day | ORAL | Status: DC
  Administered 2014-05-26 – 2014-05-27 (×2): 40 mg via ORAL
  Filled 2014-05-26: qty 1

## 2014-05-26 NOTE — Consult Note (Signed)
[facility] CON[facility] Reason for consult: diarrhea Referring Physician: Dr. Mertha Finders. Cardiologist Dr. Geralynn Combs is an 68 y.o. male.  HPI: he has no prior cardiac history and feel somewhat bad and presented to Dr. Geraldo Docker office. He had feelings for weakness fatigue and watery stools dating back to 3 days. This started after going to the gym. He began to have 10 to 12 watery bowel movements a day. He was having no chest pain or any other significant symptoms was found to be atrial fibrillation of new onset with a heart rate of 150. He is currently on diltiazem infusion and Xarelto in anticipation of cardioversion in a couple days. The patient is still having some loose stools but they are beginning to slowly become more firm. O and P exam in G.I. pathogen panel are currently pending. Patient has not had any recent travel. He's been active in working out in the gym and playing tennis. He does have a house in the [place] and has been fishing in [place] and streams placing bait on fishhooks etc. he is not had any melena or hematochezia. He does note that following radiation seed implants are prostatic cancer 4/15 he has had some leakage from the same area into his underwear but has not had chronic diarrhea or any obvious rectal bleeding. His stools previously were formed and usually once or twice a day at the most. Prior to his admission to the hospital he was having at 10 bowel movement today. He has had some cramps but no significant abdominal pain. No family history of G.I. cancer or IBD. The patient reports he had colonoscopy reported to be normal 7 or 8 years ago.  Past Medical History  Diagnosis Date  . Hypertension   . BPH (benign prostatic hyperplasia)   . Hyperlipidemia   . Prostate cancer DX  12/16/13    gleason 3+3=6, volume 48 gm, s/p seed implant 02/2014.  Marland Kitchen Arthritis     JOINTS  . History of asthma     EXERCISE INDUCED ASTHMA  1996 FOR 6 MONTHS    Past Surgical  History  Procedure Laterality Date  . Knee arthroscopy  09/25/2012    Procedure: ARTHROSCOPY KNEE;  Surgeon: Gearlean Alf, MD;  Location: [facility];  Service: Orthopedics;  Laterality: Left;  with medial and lateral meniscus Debridement  . Prostate biopsy  12/16/13    Gleason 6, volume 48 gm  . Right knee surgery  1975  . Radioactive seed implant N/A 03/18/2014    Procedure: RADIOACTIVE SEED IMPLANT;  Surgeon: [name], MD;  Location: [facility];  Service: Urology;  Laterality: N/A;    Family History  Problem Relation Age of Onset  . Dementia Mother   . Parkinson's disease Father   . Heart disease Brother     Older brother had some sort of rhythm procedure, is now active afterwards    Social History:  reports that he has never smoked. He has never used smokeless tobacco. He reports that he drinks about 7 ounces of alcohol per week. He reports that he does not use illicit drugs.  Allergies: No Known Allergies  Medications; Prior to Admission medications   Medication Sig Start Date End Date Taking? Authorizing Provider  aspirin EC 81 MG tablet Take 81 mg by mouth every evening.   Yes Historical Provider, MD  hydroxypropyl methylcellulose (ISOPTO TEARS) 2.5 % ophthalmic solution Place 1 drop into both eyes daily.   Yes Historical Provider, MD  Multiple Vitamin (MULTIVITAMIN WITH MINERALS) TABS Take 1 tablet by mouth every evening.    Yes Historical Provider, MD  rosuvastatin (CRESTOR) 10 MG tablet Take 5 mg by mouth every evening.   Yes Historical Provider, MD  valsartan-hydrochlorothiazide (DIOVAN-HCT) 160-25 MG per tablet Take 0.5 tablets by mouth every evening.   Yes Historical Provider, MD   . apixaban  5 mg Oral BID  . atorvastatin  10 mg Oral q1800  . multivitamin with minerals  1 tablet Oral QPM  . pantoprazole  40 mg Oral Daily  . polyvinyl alcohol  1 drop Both Eyes Daily   PRN Meds acetaminophen, ondansetron (ZOFRAN) IV Results for orders placed  during the hospital encounter of 05/25/14 (from the past 48 hour(s))  CBC     Status: None   Collection Time    05/25/14 11:16 AM      Result Value Ref Range   WBC 7.1  4.0 - 10.5 K/uL   RBC 4.94  4.22 - 5.81 MIL/uL   Hemoglobin 16.2  13.0 - 17.0 g/dL   HCT 47.7  39.0 - 52.0 %   MCV 96.6  78.0 - 100.0 fL   MCH 32.8  26.0 - 34.0 pg   MCHC 34.0  30.0 - 36.0 g/dL   RDW 13.3  11.5 - 15.5 %   Platelets 188  150 - 400 K/uL  COMPREHENSIVE METABOLIC PANEL     Status: Abnormal   Collection Time    05/25/14 11:16 AM      Result Value Ref Range   Sodium 139  137 - 147 mEq/L   Potassium 4.1  3.7 - 5.3 mEq/L   Chloride 100  96 - 112 mEq/L   CO2 22  19 - 32 mEq/L   Glucose, Bld 117 (*) 70 - 99 mg/dL   BUN 28 (*) 6 - 23 mg/dL   Creatinine, Ser 1.41 (*) 0.50 - 1.35 mg/dL   Calcium 9.8  8.4 - 10.5 mg/dL   Total Protein 7.2  6.0 - 8.3 g/dL   Albumin 3.2 (*) 3.5 - 5.2 g/dL   AST 19  0 - 37 U/L   ALT 16  0 - 53 U/L   Alkaline Phosphatase 78  39 - 117 U/L   Total Bilirubin 0.3  0.3 - 1.2 mg/dL   GFR calc non Af Amer 50 (*) >90 mL/min   GFR calc Af Amer 58 (*) >90 mL/min   Comment: (NOTE)     The eGFR has been calculated using the CKD EPI equation.     This calculation has not been validated in all clinical situations.     eGFR's persistently <90 mL/min signify possible Chronic Kidney     Disease.  Randolm Idol, ED     Status: None   Collection Time    05/25/14 11:24 AM      Result Value Ref Range   Troponin i, poc 0.03  0.00 - 0.08 ng/mL   Comment 3            Comment: Due to the release kinetics of cTnI,     a negative result within the first hours     of the onset of symptoms does not rule out     myocardial infarction with certainty.     If myocardial infarction is still suspected,     repeat the test at appropriate intervals.  PROTIME-INR     Status: None   Collection Time    05/25/14  4:42 PM  Result Value Ref Range   Prothrombin Time 13.0  11.6 - 15.2 seconds   INR  0.98  0.00 - 1.49  TSH     Status: None   Collection Time    05/25/14  4:42 PM      Result Value Ref Range   TSH 0.960  0.350 - 4.500 uIU/mL  T4, FREE     Status: None   Collection Time    05/25/14  4:42 PM      Result Value Ref Range   Free T4 1.59  0.80 - 1.80 ng/dL   Comment: Performed at [facility]     Status: None   Collection Time    05/25/14  4:42 PM      Result Value Ref Range   Magnesium 2.0  1.5 - 2.5 mg/dL  HEMOGLOBIN A1C     Status: Abnormal   Collection Time    05/25/14  4:42 PM      Result Value Ref Range   Hemoglobin A1C 5.8 (*) <5.7 %   Comment: (NOTE)                                                                               According to the ADA Clinical Practice Recommendations for 2011, when     HbA1c is used as a screening test:      >=6.5%   Diagnostic of Diabetes Mellitus               (if abnormal result is confirmed)     5.7-6.4%   Increased risk of developing Diabetes Mellitus     References:Diagnosis and Classification of Diabetes Mellitus,Diabetes     FUXN,2355,73(UKGUR 1):S62-S69 and Standards of Medical Care in             Diabetes - 2011,Diabetes Care,2011,34 (Suppl 1):S11-S61.   Mean Plasma Glucose 120 (*) <117 mg/dL   Comment: Performed at [facility]  CBC     Status: Abnormal   Collection Time    05/26/14  4:35 AM      Result Value Ref Range   WBC 7.2  4.0 - 10.5 K/uL   RBC 4.12 (*) 4.22 - 5.81 MIL/uL   Hemoglobin 13.2  13.0 - 17.0 g/dL   Comment: DELTA CHECK NOTED     REPEATED TO VERIFY   HCT 39.5  39.0 - 52.0 %   MCV 95.9  78.0 - 100.0 fL   MCH 32.0  26.0 - 34.0 pg   MCHC 33.4  30.0 - 36.0 g/dL   RDW 13.1  11.5 - 15.5 %   Platelets 189  150 - 400 K/uL  BASIC METABOLIC PANEL     Status: Abnormal   Collection Time    05/26/14  4:35 AM      Result Value Ref Range   Sodium 140  137 - 147 mEq/L   Potassium 4.1  3.7 - 5.3 mEq/L   Chloride 102  96 - 112 mEq/L   CO2 23  19 - 32 mEq/L   Glucose, Bld  100 (*) 70 - 99 mg/dL   BUN 23  6 - 23 mg/dL   Creatinine, Ser 1.18  0.50 -  1.35 mg/dL   Calcium 8.8  8.4 - 10.5 mg/dL   GFR calc non Af Amer 62 (*) >90 mL/min   GFR calc Af Amer 71 (*) >90 mL/min   Comment: (NOTE)     The eGFR has been calculated using the CKD EPI equation.     This calculation has not been validated in all clinical situations.     eGFR's persistently <90 mL/min signify possible Chronic Kidney     Disease.  LIPID PANEL     Status: Abnormal   Collection Time    05/26/14  4:35 AM      Result Value Ref Range   Cholesterol 105  0 - 200 mg/dL   Triglycerides 117  <150 mg/dL   HDL 39 (*) >39 mg/dL   Total CHOL/HDL Ratio 2.7     VLDL 23  0 - 40 mg/dL   LDL Cholesterol 43  0 - 99 mg/dL   Comment:            Total Cholesterol/HDL:CHD Risk     Coronary Heart Disease Risk Table                         Men   Women      1/2 Average Risk   3.4   3.3      Average Risk       5.0   4.4      2 X Average Risk   9.6   7.1      3 X Average Risk  23.4   11.0                Use the calculated Patient Ratio     above and the CHD Risk Table     to determine the patient's CHD Risk.                ATP III CLASSIFICATION (LDL):      <100     mg/dL   Optimal      100-129  mg/dL   Near or Above                        Optimal      130-159  mg/dL   Borderline      160-189  mg/dL   High      >190     mg/dL   Very High  CBC WITH DIFFERENTIAL     Status: Abnormal   Collection Time    05/26/14  8:35 AM      Result Value Ref Range   WBC 8.2  4.0 - 10.5 K/uL   RBC 4.49  4.22 - 5.81 MIL/uL   Hemoglobin 14.7  13.0 - 17.0 g/dL   HCT 43.1  39.0 - 52.0 %   MCV 96.0  78.0 - 100.0 fL   MCH 32.7  26.0 - 34.0 pg   MCHC 34.1  30.0 - 36.0 g/dL   RDW 13.1  11.5 - 15.5 %   Platelets 196  150 - 400 K/uL   Neutrophils Relative % 61  43 - 77 %   Lymphocytes Relative 24  12 - 46 %   Monocytes Relative 13 (*) 3 - 12 %   Eosinophils Relative 1  0 - 5 %   Basophils Relative 1  0 - 1 %   Neutro Abs  4.9  1.7 - 7.7 K/uL   Lymphs Abs 2.0  0.7 - 4.0 K/uL   Monocytes Absolute 1.1 (*) 0.1 - 1.0 K/uL   Eosinophils Absolute 0.1  0.0 - 0.7 K/uL   Basophils Absolute 0.1  0.0 - 0.1 K/uL    Dg Chest 2 View  05/25/2014   CLINICAL DATA:  Hypertension and tachycardia.  EXAM: CHEST  2 VIEW  COMPARISON:  02/17/2014.  FINDINGS: The cardiac silhouette, mediastinal and hilar contours are within normal limits and stable. The lungs are clear. No pleural effusion. The bony thorax is intact.  IMPRESSION: No acute cardiopulmonary findings.   Electronically Signed   By: Kalman Jewels M.D.   On: 05/25/2014 14:28              Blood pressure 86/61, pulse 103, temperature 98.2 F (36.8 C), temperature source Oral, resp. rate 16, height _0  (1.803 m), weight 83.008 kg (183 lb), SpO2 98.00%.  Physical exam:   General-- talkative white male and absolutely no distress Heart-- regular rate and rhythm without murmurs are gallops Lungs--clear Abdomen-- none distended in soft with good bowel sounds impossibly hyperactive bowel sounds. Abdomen is nontender.   Assessment: 1. Acute diarrhea. This is almost certainly infectious and the most likely possibility is Giardia 2. New Onset atrial fibrillation. This is currently being treated the patient is due for cardioversion in another day or to  Plan: 1. Would continue him on the heart healthy low residue diet. We wait for the results of the G.I. pathogen panel. If Giardia present would recommend treatment with Flagyl. Patient is due to go to [place] this weekend. Hopefully his diarrhea will improve prior to the trip   Thomasene Dubow JR,Jerricka Carvey L 05/26/2014, 3:51 PM

## 2014-05-26 NOTE — Progress Notes (Signed)
  Echocardiogram 2D Echocardiogram has been performed.  [name] 05/26/2014, 10:59 AM

## 2014-05-26 NOTE — Progress Notes (Signed)
UR Completed.  Brown, Sarah Jane [phone] 05/26/2014  

## 2014-05-26 NOTE — Discharge Instructions (Addendum)
Information on my medicine - ELIQUIS (apixaban)  This medication education was reviewed with me or my healthcare representative as part of my discharge preparation.   Why was Eliquis prescribed for you? Eliquis was prescribed for you to reduce the risk of a blood clot forming that can cause a stroke if you have a medical condition called atrial fibrillation (a type of irregular heartbeat).  What do You need to know about Eliquis ? Take your Eliquis TWICE DAILY - one tablet in the morning and one tablet in the evening with or without food. If you have difficulty swallowing the tablet whole please discuss with your pharmacist how to take the medication safely.  Take Eliquis exactly as prescribed by your doctor and DO NOT stop taking Eliquis without talking to the doctor who prescribed the medication.  Stopping may increase your risk of developing a stroke.  Refill your prescription before you run out.  After discharge, you should have regular check-up appointments with your healthcare provider that is prescribing your Eliquis.  In the future your dose may need to be changed if your kidney function or weight changes by a significant amount or as you get older.  What do you do if you miss a dose? If you miss a dose, take it as soon as you remember on the same day and resume taking twice daily.  Do not take more than one dose of ELIQUIS at the same time to make up a missed dose.  Important Safety Information A possible side effect of Eliquis is bleeding. You should call your healthcare provider right away if you experience any of the following:   Bleeding from an injury or your nose that does not stop.   Unusual colored urine (red or dark brown) or unusual colored stools (red or black).   Unusual bruising for unknown reasons.   A serious fall or if you hit your head (even if there is no bleeding).  Some medicines may interact with Eliquis and might increase your risk of bleeding or  clotting while on Eliquis. To help avoid this, consult your healthcare provider or pharmacist prior to using any new prescription or non-prescription medications, including herbals, vitamins, non-steroidal anti-inflammatory drugs (NSAIDs) and supplements.  This website has more information on Eliquis (apixaban): [email or web address].  Atrial Fibrillation Atrial fibrillation is a type of irregular heart rhythm (arrhythmia). During atrial fibrillation, the upper chambers of the heart (atria) quiver continuously in a chaotic pattern. This causes an irregular and often rapid heart rate.  Atrial fibrillation is the result of the heart becoming overloaded with disorganized signals that tell it to beat. These signals are normally released one at a time by a part of the right atrium called the sinoatrial node. They then travel from the atria to the lower chambers of the heart (ventricles), causing the atria and ventricles to contract and pump blood as they pass. In atrial fibrillation, parts of the atria outside of the sinoatrial node also release these signals. This results in two problems. First, the atria receive so many signals that they do not have time to fully contract. Second, the ventricles, which can only receive one signal at a time, beat irregularly and out of rhythm with the atria.  There are three types of atrial fibrillation:   Paroxysmal. Paroxysmal atrial fibrillation starts suddenly and stops on its own within a week.  Persistent. Persistent atrial fibrillation lasts for more than a week. It may stop on its own or with treatment.  Permanent.  Permanent atrial fibrillation does not go away. Episodes of atrial fibrillation may lead to permanent atrial fibrillation. Atrial fibrillation can prevent your heart from pumping blood normally. It increases your risk of stroke and can lead to heart failure.  CAUSES   Heart conditions, including a heart attack, heart failure, coronary artery disease, and  heart valve conditions.   Inflammation of the sac that surrounds the heart (pericarditis).  Blockage of an artery in the lungs (pulmonary embolism).  Pneumonia or other infections.  Chronic lung disease.  Thyroid problems, especially if the thyroid is overactive (hyperthyroidism).  Caffeine, excessive alcohol use, and use of some illegal drugs.   Use of some medicines, including certain decongestants and diet pills.  Heart surgery.   Birth defects.  Sometimes, no cause can be found. When this happens, the atrial fibrillation is called lone atrial fibrillation. The risk of complications from atrial fibrillation increases if you have lone atrial fibrillation and you are age 34 years or older. RISK FACTORS  Heart failure.  Coronary artery disease.  Diabetes mellitus.   High blood pressure (hypertension).   Obesity.   Other arrhythmias.   Increased age. SYMPTOMS   A feeling that your heart is beating rapidly or irregularly.   A feeling of discomfort or pain in your chest.   Shortness of breath.   Sudden light-headedness or weakness.   Getting tired easily when exercising.   Urinating more often than normal (mainly when atrial fibrillation first begins).  In paroxysmal atrial fibrillation, symptoms may start and suddenly stop. DIAGNOSIS  Your health care provider may be able to detect atrial fibrillation when taking your pulse. Your health care provider may have you take a test called an ambulatory electrocardiogram (ECG). An ECG records your heartbeat patterns over a 24-hour period. You may also have other tests, such as:  Transthoracic echocardiogram (TTE). During echocardiography, sound waves are used to evaluate how blood flows through your heart.  Transesophageal echocardiogram (TEE).  Stress test. There is more than one type of stress test. If a stress test is needed, ask your health care provider about which type is best for you.   Chest  X-ray exam.  Blood tests.  Computed tomography (CT). TREATMENT  Treatment may include:  Treating any underlying conditions. For example, if you have an overactive thyroid, treating the condition may correct atrial fibrillation.  Taking medicine. Medicines may be given to control a rapid heart rate or to prevent blood clots, heart failure, or a stroke.  Having a procedure to correct the rhythm of the heart:  Electrical cardioversion. During electrical cardioversion, a controlled, low-energy shock is delivered to the heart through your skin. If you have chest pain, very low blood pressure, or sudden heart failure, this procedure may need to be done as an emergency.  Catheter ablation. During this procedure, heart tissues that send the signals that cause atrial fibrillation are destroyed.  Maze or minimaze procedure. During this surgery, thin lines of heart tissue that carry the abnormal signals are destroyed. The maze procedure is an open-heart surgery. The minimaze procedure is a minimally invasive surgery. This means that small cuts are made to access the heart instead of a large opening.  Pulmonary venous isolation. During this surgery, tissue around the veins that carry blood from the lungs (pulmonary veins) is destroyed. This tissue is thought to carry the abnormal signals. HOME CARE INSTRUCTIONS   Only take medicines that your health care provider approves, and take them as directed. Some medicines can  make atrial fibrillation worse or recur.  If blood thinners were prescribed by your health care provider, take them exactly as directed. Too much blood-thinning medicine can cause bleeding. If you take too little, you will not have the needed protection against stroke and other problems.  Perform blood tests at home if directed by your health care provider. Perform blood tests exactly as directed.  Quit smoking if you smoke.  Do not drink alcohol.  Do not drink caffeinated  beverages such as coffee, soda, and some teas. You may drink decaffeinated coffee, soda, or tea.   Maintain a healthy weight.Do not use diet pills unless your health care provider approves. They may make heart problems worse.   Follow diet instructions as directed by your health care provider.  Exercise regularly as directed by your health care provider.  Keep all follow-up appointments with your health care provider. PREVENTION  The following substances can cause atrial fibrillation to recur:   Caffeinated beverages.  Alcohol.  Certain medicines, especially those used for breathing problems.  Certain herbs and herbal medicines, such as those containing ephedra or ginseng.  Illegal drugs, such as cocaine and amphetamines. Sometimes medicines are given to prevent atrial fibrillation from recurring. Proper treatment of any underlying condition is also important in helping prevent recurrence.  SEEK MEDICAL CARE IF:  You notice a change in the rate, rhythm, or strength of your heartbeat.  You suddenly begin urinating more frequently.  You tire more easily when exerting yourself or exercising. SEEK IMMEDIATE MEDICAL CARE IF:   You have chest pain, abdominal pain, sweating, or weakness.  You feel nauseous.  You have shortness of breath.  You suddenly have swollen feet and ankles.  You feel dizzy.  Your face or limbs feel numb or weak.  You have a change in your vision or speech. MAKE SURE YOU:   Understand these instructions.  Will watch your condition.  Will get help right away if you are not doing well or get worse. Document Released: 11/13/2005 Document Revised: 11/18/2013 Document Reviewed: 12/24/2012 [facility] Patient Information 2015 [place], [place]. This information is not intended to replace advice given to you by your health care provider. Make sure you discuss any questions you have with your health care provider.

## 2014-05-26 NOTE — ED Provider Notes (Signed)
I saw and evaluated the patient, reviewed the resident's note and I agree with the findings and plan.   EKG Interpretation   Date/Time:  Monday May 25 2014 10:59:28 EDT Ventricular Rate:  151 PR Interval:    QRS Duration: 91 QT Interval:  353 QTC Calculation: 559 R Axis:   78 Text Interpretation:  Atrial flutter with predominant 2:1 AV block  Non-specific ST-t changes Confirmed by STEINL  MD, Lennette Bihari ([id number]) on  05/25/2014 11:26:35 AM      Pt w hx htn, presents w rapid heart beat, mild sob. No syncope. No episodic cp.  Pt denies hx dysrhythmia. No hx cad. Denies etoh or drug abuse. No recent change in meds.   Pt w rapid afib/flutter on ecg, continuous pulse ox and monitor. o2 [place]. Iv.  Prior charts and nursing notes reviewed.  cardizem iv bolus and gtt. Initial improvement in rate, remains in afib.    Recheck hr back in 150s, narrow complex, afib/flutter.  rebolus w cardizem, increased drip rate.  Cardiology consulted.  Recheck heart rate improved in 80's. No cp.   Results for orders placed during the hospital encounter of 05/25/14  CBC      Result Value Ref Range   WBC 7.1  4.0 - 10.5 K/uL   RBC 4.94  4.22 - 5.81 MIL/uL   Hemoglobin 16.2  13.0 - 17.0 g/dL   HCT 47.7  39.0 - 52.0 %   MCV 96.6  78.0 - 100.0 fL   MCH 32.8  26.0 - 34.0 pg   MCHC 34.0  30.0 - 36.0 g/dL   RDW 13.3  11.5 - 15.5 %   Platelets 188  150 - 400 K/uL  COMPREHENSIVE METABOLIC PANEL      Result Value Ref Range   Sodium 139  137 - 147 mEq/L   Potassium 4.1  3.7 - 5.3 mEq/L   Chloride 100  96 - 112 mEq/L   CO2 22  19 - 32 mEq/L   Glucose, Bld 117 (*) 70 - 99 mg/dL   BUN 28 (*) 6 - 23 mg/dL   Creatinine, Ser 1.41 (*) 0.50 - 1.35 mg/dL   Calcium 9.8  8.4 - 10.5 mg/dL   Total Protein 7.2  6.0 - 8.3 g/dL   Albumin 3.2 (*) 3.5 - 5.2 g/dL   AST 19  0 - 37 U/L   ALT 16  0 - 53 U/L   Alkaline Phosphatase 78  39 - 117 U/L   Total Bilirubin 0.3  0.3 - 1.2 mg/dL   GFR calc non Af Amer 50 (*) >90 mL/min   GFR calc Af Amer 58 (*) >90 mL/min  PROTIME-INR      Result Value Ref Range   Prothrombin Time 13.0  11.6 - 15.2 seconds   INR 0.98  0.00 - 1.49  TSH      Result Value Ref Range   TSH 0.960  0.350 - 4.500 uIU/mL  T4, FREE      Result Value Ref Range   Free T4 1.59  0.80 - 1.80 ng/dL  MAGNESIUM      Result Value Ref Range   Magnesium 2.0  1.5 - 2.5 mg/dL  HEMOGLOBIN A1C      Result Value Ref Range   Hemoglobin A1C 5.8 (*) <5.7 %   Mean Plasma Glucose 120 (*) <117 mg/dL  CBC      Result Value Ref Range   WBC 7.2  4.0 - 10.5 K/uL   RBC 4.12 (*)  4.22 - 5.81 MIL/uL   Hemoglobin 13.2  13.0 - 17.0 g/dL   HCT 39.5  39.0 - 52.0 %   MCV 95.9  78.0 - 100.0 fL   MCH 32.0  26.0 - 34.0 pg   MCHC 33.4  30.0 - 36.0 g/dL   RDW 13.1  11.5 - 15.5 %   Platelets 189  150 - 400 K/uL  BASIC METABOLIC PANEL      Result Value Ref Range   Sodium 140  137 - 147 mEq/L   Potassium 4.1  3.7 - 5.3 mEq/L   Chloride 102  96 - 112 mEq/L   CO2 23  19 - 32 mEq/L   Glucose, Bld 100 (*) 70 - 99 mg/dL   BUN 23  6 - 23 mg/dL   Creatinine, Ser 1.18  0.50 - 1.35 mg/dL   Calcium 8.8  8.4 - 10.5 mg/dL   GFR calc non Af Amer 62 (*) >90 mL/min   GFR calc Af Amer 71 (*) >90 mL/min  LIPID PANEL      Result Value Ref Range   Cholesterol 105  0 - 200 mg/dL   Triglycerides 117  <150 mg/dL   HDL 39 (*) >39 mg/dL   Total CHOL/HDL Ratio 2.7     VLDL 23  0 - 40 mg/dL   LDL Cholesterol 43  0 - 99 mg/dL  I-STAT TROPOININ, ED      Result Value Ref Range   Troponin i, poc 0.03  0.00 - 0.08 ng/mL   Comment 3            Dg Chest 2 View  05/25/2014   CLINICAL DATA:  Hypertension and tachycardia.  EXAM: CHEST  2 VIEW  COMPARISON:  02/17/2014.  FINDINGS: The cardiac silhouette, mediastinal and hilar contours are within normal limits and stable. The lungs are clear. No pleural effusion. The bony thorax is intact.  IMPRESSION: No acute cardiopulmonary findings.   Electronically Signed   By: Kalman Jewels M.D.   On:  05/25/2014 14:28    CRITICAL CARE  RE: atrial fibrillation/flutter w rapid ventricular response requiring iv cardizem bolus/rebolus/drip, dyspnea, new onset dysrhythmia Performed by: Mirna Mires Total critical care time: 35 Critical care time was exclusive of separately billable procedures and treating other patients. Critical care was necessary to treat or prevent imminent or life-threatening deterioration. Critical care was time spent personally by me on the following activities: development of treatment plan with patient and/or surrogate as well as nursing, discussions with consultants, evaluation of patient's response to treatment, examination of patient, obtaining history from patient or surrogate, ordering and performing treatments and interventions, ordering and review of laboratory studies, ordering and review of radiographic studies, pulse oximetry and re-evaluation of patient's condition.     Mirna Mires, MD 05/26/14 [phone]

## 2014-05-26 NOTE — Progress Notes (Signed)
Patient Name: William Combs Date of Encounter: 05/26/2014     Active Problems:   Prostate cancer   Acute gastroenteritis   Atrial fibrillation and flutter   HTN (hypertension)   AKI (acute kidney injury)   Hyperlipemia   Sigmoid diverticulosis   Chronic diarrhea    SUBJECTIVE  Denies any chest pain or SOB. Does not have any palpitation. He was surprised to be told yesterday he was in a-fib/a-flutter. He states he had no cardiac problem before. He is worried how this is going to affect his active lifestyle which include hiking and tennis. Has remained NPO since midnight. Continue to have diarrhea.    CURRENT MEDS . apixaban  5 mg Oral BID  . atorvastatin  10 mg Oral q1800  . multivitamin with minerals  1 tablet Oral QPM  . pantoprazole  40 mg Oral Daily  . polyvinyl alcohol  1 drop Both Eyes Daily    OBJECTIVE  Filed Vitals:   05/25/14 2000 05/26/14 0000 05/26/14 0400 05/26/14 0800  BP: 98/56 107/68 91/51 92/64   Pulse: 107 81 75 102  Temp: 97.9 F (36.6 C) 98.7 F (37.1 C) 98.2 F (36.8 C) 98.2 F (36.8 C)  TempSrc:    Oral  Resp: 18 18 16 16   Height:      Weight:   183 lb (83.008 kg)   SpO2: 96% 96% 96% 98%    Intake/Output Summary (Last 24 hours) at 05/26/14 0808 Last data filed at 05/26/14 0500  Gross per 24 hour  Intake 1343.75 ml  Output      0 ml  Net 1343.75 ml   Filed Weights   05/25/14 1528 05/26/14 0400  Weight: 185 lb (83.915 kg) 183 lb (83.008 kg)    PHYSICAL EXAM  General: Pleasant, NAD. Neuro: Alert and oriented X 3. Moves all extremities spontaneously. Psych: Normal affect. HEENT:  Normal  Neck: Supple without bruits or JVD. Lungs:  Resp regular and unlabored, CTA. Heart: RRR no s3, s4, or murmurs. Abdomen: Soft, non-distended, BS + x 4. Mildly sore after so many diarrhea  Extremities: No clubbing, cyanosis or edema. DP/PT/Radials 2+ and equal bilaterally.  Accessory Clinical Findings  CBC  Recent Labs  05/25/14 1116  05/26/14 0435  WBC 7.1 7.2  HGB 16.2 13.2  HCT 47.7 39.5  MCV 96.6 95.9  PLT 188 706   Basic Metabolic Panel  Recent Labs  05/25/14 1116 05/25/14 1642 05/26/14 0435  NA 139  --  140  K 4.1  --  4.1  CL 100  --  102  CO2 22  --  23  GLUCOSE 117*  --  100*  BUN 28*  --  23  CREATININE 1.41*  --  1.18  CALCIUM 9.8  --  8.8  MG  --  2.0  --    Liver Function Tests  Recent Labs  05/25/14 1116  AST 19  ALT 16  ALKPHOS 78  BILITOT 0.3  PROT 7.2  ALBUMIN 3.2*   Hemoglobin A1C  Recent Labs  05/25/14 1642  HGBA1C 5.8*   Fasting Lipid Panel  Recent Labs  05/26/14 0435  CHOL 105  HDL 39*  LDLCALC 43  TRIG 117  CHOLHDL 2.7   Thyroid Function Tests  Recent Labs  05/25/14 1642  TSH 0.960    TELE  Alternating a-flutter and a-fib with HR 70-90s, occasionally >100  ECG  A-fib with HR 90s  Radiology/Studies  Dg Chest 2 View  05/25/2014   CLINICAL DATA:  Hypertension and tachycardia.  EXAM: CHEST  2 VIEW  COMPARISON:  02/17/2014.  FINDINGS: The cardiac silhouette, mediastinal and hilar contours are within normal limits and stable. The lungs are clear. No pleural effusion. The bony thorax is intact.  IMPRESSION: No acute cardiopulmonary findings.   Electronically Signed   By: Kalman Jewels M.D.   On: 05/25/2014 14:28    ASSESSMENT AND PLAN  1. A-flutter/a-fib: presented with a-flutter, converted to a-fib after dilt bolus  - unknown duration, may be exacerbated by recent diarrhea and electrolyte shift  - TSH and free T4 normal  - CHA2DS2-Vasc score of 2 (age and HTN)  - with his active lifestyle, NOAC is a better choice, continue eliquis  - discussed with patient regarding treatment modalities of his arrythmia including TEE w/ cardioversion, anticoagulate for 3 week before cardiovert, or leave in a-fib with rate control med and anticoagulant. If this is driven by diarrhea, his a-fib may be converted back by itself. However his HR has been variable with  frequent RVR episode, given his active lifestyle and his upcoming trip to [place] this coming Saturday, may also consider TEE w/ cardioversion.    2. HTN 3. Hyperlipidemia 4. Prostate cancer s/p seed implant 02/2014 5. Acute on chronic renal insufficiency  - Cr 1.41 --> 1.18 6. Diarrhea since last Fri  - GI stool test pending  Ruben Im Pager: [phone] Patient seen and examined. I agree with the assessment and plan as detailed above. See also my additional thoughts below.   The patient has marked variation in his heart rate with his atrial arrhythmia. This morning it looks more like flutter and in A. fib. His anticoagulation has been started. We will proceed with TEE cardioversion during this admission. This will be done after the patient has received 5 doses of Eloquis. This may not be able to be done until Thursday. In the meantime, we're trying to stabilize, diagnosis, and treat his diarrhea.   Dola Argyle, MD, [facility] 05/26/2014 10:18 AM

## 2014-05-26 NOTE — Progress Notes (Signed)
Subjective: William Combs is feeling better today.  Able to eat last night.  Diarrhea has been ongoing for 3-4 weeks.  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  Not unlike Giardia.  [place] atrial fibrillation likely secondary to stress from GI illness.  Thyroid function normal.  Will check stool for ova and parasites as well as enteric pathogens.  Patient had milk products last night and seemed to stimulate the diarrhea more.  Denies chest pain or shortness of breath  Objective: Weight change:   Intake/Output Summary (Last 24 hours) at 05/26/14 0713 Last data filed at 05/26/14 0500  Gross per 24 hour  Intake 1343.75 ml  Output      0 ml  Net 1343.75 ml   Filed Vitals:   05/25/14 1528 05/25/14 2000 05/26/14 0000 05/26/14 0400  BP: 115/72 98/56 107/68 91/51  Pulse: 81 107 81 75  Temp: 98.1 F (36.7 C) 97.9 F (36.6 C) 98.7 F (37.1 C) 98.2 F (36.8 C)  TempSrc: Oral     Resp: 16 18 18 16   Height: 5' 11"  (1.803 m)     Weight: 185 lb (83.915 kg)   183 lb (83.008 kg)  SpO2: 100% 96% 96% 96%    General Appearance: Alert, cooperative, no distress, appears stated age Lungs: Clear to auscultation bilaterally, respirations unlabored Heart: Regular rhythm, S1 and S2 normal, no murmur, rub or gallop.  Rate slightly increased Abdomen: Soft, diffusely mildly tender, bowel sounds active all four quadrants, no masses, no organomegaly Extremities: Extremities normal, atraumatic, no cyanosis or edema Neuro: Alert and oriented, nonfocal  Lab Results: Results for orders placed during the hospital encounter of 05/25/14 (from the past 48 hour(s))  CBC     Status: None   Collection Time    05/25/14 11:16 AM      Result Value Ref Range   WBC 7.1  4.0 - 10.5 K/uL   RBC 4.94  4.22 - 5.81 MIL/uL   Hemoglobin 16.2  13.0 - 17.0 g/dL   HCT 47.7  39.0 - 52.0 %   MCV 96.6  78.0 - 100.0 fL   MCH 32.8  26.0 - 34.0 pg   MCHC 34.0  30.0 - 36.0 g/dL   RDW 13.3   11.5 - 15.5 %   Platelets 188  150 - 400 K/uL  COMPREHENSIVE METABOLIC PANEL     Status: Abnormal   Collection Time    05/25/14 11:16 AM      Result Value Ref Range   Sodium 139  137 - 147 mEq/L   Potassium 4.1  3.7 - 5.3 mEq/L   Chloride 100  96 - 112 mEq/L   CO2 22  19 - 32 mEq/L   Glucose, Bld 117 (*) 70 - 99 mg/dL   BUN 28 (*) 6 - 23 mg/dL   Creatinine, Ser 1.41 (*) 0.50 - 1.35 mg/dL   Calcium 9.8  8.4 - 10.5 mg/dL   Total Protein 7.2  6.0 - 8.3 g/dL   Albumin 3.2 (*) 3.5 - 5.2 g/dL   AST 19  0 - 37 U/L   ALT 16  0 - 53 U/L   Alkaline Phosphatase 78  39 - 117 U/L   Total Bilirubin 0.3  0.3 - 1.2 mg/dL   GFR calc non Af Amer 50 (*) >90 mL/min   GFR calc Af Amer 58 (*) >90 mL/min   Comment: (NOTE)     The eGFR has been calculated using the CKD EPI  equation.     This calculation has not been validated in all clinical situations.     eGFR's persistently <90 mL/min signify possible Chronic Kidney     Disease.  Randolm Idol, ED     Status: None   Collection Time    05/25/14 11:24 AM      Result Value Ref Range   Troponin i, poc 0.03  0.00 - 0.08 ng/mL   Comment 3            Comment: Due to the release kinetics of cTnI,     a negative result within the first hours     of the onset of symptoms does not rule out     myocardial infarction with certainty.     If myocardial infarction is still suspected,     repeat the test at appropriate intervals.  PROTIME-INR     Status: None   Collection Time    05/25/14  4:42 PM      Result Value Ref Range   Prothrombin Time 13.0  11.6 - 15.2 seconds   INR 0.98  0.00 - 1.49  TSH     Status: None   Collection Time    05/25/14  4:42 PM      Result Value Ref Range   TSH 0.960  0.350 - 4.500 uIU/mL  T4, FREE     Status: None   Collection Time    05/25/14  4:42 PM      Result Value Ref Range   Free T4 1.59  0.80 - 1.80 ng/dL   Comment: Performed at [facility]     Status: None   Collection Time    05/25/14   4:42 PM      Result Value Ref Range   Magnesium 2.0  1.5 - 2.5 mg/dL  HEMOGLOBIN A1C     Status: Abnormal   Collection Time    05/25/14  4:42 PM      Result Value Ref Range   Hemoglobin A1C 5.8 (*) <5.7 %   Comment: (NOTE)                                                                               According to the ADA Clinical Practice Recommendations for 2011, when     HbA1c is used as a screening test:      >=6.5%   Diagnostic of Diabetes Mellitus               (if abnormal result is confirmed)     5.7-6.4%   Increased risk of developing Diabetes Mellitus     References:Diagnosis and Classification of Diabetes Mellitus,Diabetes     DEYC,1448,18(HUDJS 1):S62-S69 and Standards of Medical Care in             Diabetes - 2011,Diabetes Care,2011,34 (Suppl 1):S11-S61.   Mean Plasma Glucose 120 (*) <117 mg/dL   Comment: Performed at [facility]  CBC     Status: Abnormal   Collection Time    05/26/14  4:35 AM      Result Value Ref Range   WBC 7.2  4.0 - 10.5 K/uL   RBC 4.12 (*) 4.22 -  5.81 MIL/uL   Hemoglobin 13.2  13.0 - 17.0 g/dL   Comment: DELTA CHECK NOTED     REPEATED TO VERIFY   HCT 39.5  39.0 - 52.0 %   MCV 95.9  78.0 - 100.0 fL   MCH 32.0  26.0 - 34.0 pg   MCHC 33.4  30.0 - 36.0 g/dL   RDW 13.1  11.5 - 15.5 %   Platelets 189  150 - 400 K/uL  BASIC METABOLIC PANEL     Status: Abnormal   Collection Time    05/26/14  4:35 AM      Result Value Ref Range   Sodium 140  137 - 147 mEq/L   Potassium 4.1  3.7 - 5.3 mEq/L   Chloride 102  96 - 112 mEq/L   CO2 23  19 - 32 mEq/L   Glucose, Bld 100 (*) 70 - 99 mg/dL   BUN 23  6 - 23 mg/dL   Creatinine, Ser 1.18  0.50 - 1.35 mg/dL   Calcium 8.8  8.4 - 10.5 mg/dL   GFR calc non Af Amer 62 (*) >90 mL/min   GFR calc Af Amer 71 (*) >90 mL/min   Comment: (NOTE)     The eGFR has been calculated using the CKD EPI equation.     This calculation has not been validated in all clinical situations.     eGFR's persistently <90  mL/min signify possible Chronic Kidney     Disease.  LIPID PANEL     Status: Abnormal   Collection Time    05/26/14  4:35 AM      Result Value Ref Range   Cholesterol 105  0 - 200 mg/dL   Triglycerides 117  <150 mg/dL   HDL 39 (*) >39 mg/dL   Total CHOL/HDL Ratio 2.7     VLDL 23  0 - 40 mg/dL   LDL Cholesterol 43  0 - 99 mg/dL   Comment:            Total Cholesterol/HDL:CHD Risk     Coronary Heart Disease Risk Table                         Men   Women      1/2 Average Risk   3.4   3.3      Average Risk       5.0   4.4      2 X Average Risk   9.6   7.1      3 X Average Risk  23.4   11.0                Use the calculated Patient Ratio     above and the CHD Risk Table     to determine the patient's CHD Risk.                ATP III CLASSIFICATION (LDL):      <100     mg/dL   Optimal      100-129  mg/dL   Near or Above                        Optimal      130-159  mg/dL   Borderline      160-189  mg/dL   High      >190     mg/dL   Very High    Studies/Results: Dg Chest 2  View  05/25/2014   CLINICAL DATA:  Hypertension and tachycardia.  EXAM: CHEST  2 VIEW  COMPARISON:  02/17/2014.  FINDINGS: The cardiac silhouette, mediastinal and hilar contours are within normal limits and stable. The lungs are clear. No pleural effusion. The bony thorax is intact.  IMPRESSION: No acute cardiopulmonary findings.   Electronically Signed   By: Kalman Jewels M.D.   On: 05/25/2014 14:28   Medications: Scheduled Meds: . apixaban  5 mg Oral BID  . atorvastatin  10 mg Oral q1800  . multivitamin with minerals  1 tablet Oral QPM  . pantoprazole  40 mg Oral Daily  . polyvinyl alcohol  1 drop Both Eyes Daily   Continuous Infusions: . sodium chloride    . diltiazem (CARDIZEM) infusion 5 mg/hr (05/25/14 1703)   PRN Meds:.acetaminophen, ondansetron (ZOFRAN) IV  Assessment/Plan: Active Problems:   Acute gastroenteritis - send stool for enteric pathogens and O&P - lactose free diet - GI consult    Atrial fibrillation and flutter - as per cardiology   HTN (hypertension) - controlled - takes Diovan HCT chronically   AKI (acute kidney injury) - creatinine back down to 1.1   Hyperlipemia - history of - normal Crestor 10 milligrams daily   Prostate cancer - treated with seed implantation by Dr. Risa Grill in April, 2015, 2 months ago   Sigmoid diverticulosis - aware    LOS: 1 day   Henrine Screws, MD 05/26/2014, 7:13 AM

## 2014-05-27 LAB — OVA AND PARASITE EXAMINATION

## 2014-05-27 LAB — GI PATHOGEN PANEL BY PCR, STOOL
C DIFFICILE TOXIN A/B: NEGATIVE
Campylobacter by PCR: POSITIVE
Cryptosporidium by PCR: NEGATIVE
E COLI (STEC): NEGATIVE
E coli (ETEC) LT/ST: NEGATIVE
E coli 0157 by PCR: NEGATIVE
G LAMBLIA BY PCR: NEGATIVE
Norovirus GI/GII: NEGATIVE
ROTAVIRUS A BY PCR: NEGATIVE
SHIGELLA BY PCR: NEGATIVE
Salmonella by PCR: NEGATIVE

## 2014-05-27 LAB — BASIC METABOLIC PANEL
BUN: 16 mg/dL (ref 6–23)
CHLORIDE: 103 meq/L (ref 96–112)
CO2: 25 mEq/L (ref 19–32)
Calcium: 8.8 mg/dL (ref 8.4–10.5)
Creatinine, Ser: 1.16 mg/dL (ref 0.50–1.35)
GFR calc Af Amer: 73 mL/min — ABNORMAL LOW (ref >90)
GFR calc non Af Amer: 63 mL/min — ABNORMAL LOW (ref >90)
Glucose, Bld: 95 mg/dL (ref 70–99)
POTASSIUM: 4 meq/L (ref 3.7–5.3)
Sodium: 139 mEq/L (ref 137–147)

## 2014-05-27 LAB — CBC WITH DIFFERENTIAL/PLATELET
BASOS ABS: 0.1 10*3/uL (ref 0.0–0.1)
Basophils Relative: 1 % (ref 0–1)
Eosinophils Absolute: 0.1 10*3/uL (ref 0.0–0.7)
Eosinophils Relative: 1 % (ref 0–5)
HEMATOCRIT: 38 % — AB (ref 39.0–52.0)
Hemoglobin: 13 g/dL (ref 13.0–17.0)
LYMPHS PCT: 28 % (ref 12–46)
Lymphs Abs: 2 10*3/uL (ref 0.7–4.0)
MCH: 32.4 pg (ref 26.0–34.0)
MCHC: 34.2 g/dL (ref 30.0–36.0)
MCV: 94.8 fL (ref 78.0–100.0)
MONOS PCT: 13 % — AB (ref 3–12)
Monocytes Absolute: 0.9 10*3/uL (ref 0.1–1.0)
NEUTROS ABS: 3.9 10*3/uL (ref 1.7–7.7)
Neutrophils Relative %: 57 % (ref 43–77)
PLATELETS: 199 10*3/uL (ref 150–400)
RBC: 4.01 MIL/uL — ABNORMAL LOW (ref 4.22–5.81)
RDW: 13.1 % (ref 11.5–15.5)
WBC: 7 10*3/uL (ref 4.0–10.5)

## 2014-05-27 NOTE — Progress Notes (Signed)
Patient ID: William Combs, male   DOB: 05-Jun-1946, 68 y.o.   MRN: [id number]  I have not yet seen the patient or his monitor this morning. Yesterday he was pushing to go home. I will speak with Dr. Inda Merlin early this morning to decide the timing of the remainder of his workup. If his GI status is completely stable and Dr. Inda Merlin wants to send him home today, we can arrange for outpatient TEE cardioversion tomorrow. Otherwise we can arrange for TE cardioversion in the hospital tomorrow. I cannot yet be certain of the timing.   Daryel November, MD  [phone]

## 2014-05-27 NOTE — Progress Notes (Signed)
[facility] PROGRESS NOTE Subjective patient is stable. This on Xarelto and still is an atrial flutter cardioversion plan for tomorrow. Reports in his diarrhea may be a little better. G.I. pathogen panel shows Campylobacter  Objective: Vital signs in last 24 hours: Temp:  [97.8 F (36.6 C)-98.7 F (37.1 C)] 98.4 F (36.9 C) (07/01 1356) Pulse Rate:  [56-99] 77 (07/01 1356) Resp:  [16-18] 18 (07/01 1356) BP: (94-123)/(61-88) 117/84 mmHg (07/01 1356) SpO2:  [96 %-99 %] 99 % (07/01 1356) Weight:  [82.988 kg (182 lb 15.3 oz)] 82.988 kg (182 lb 15.3 oz) (07/01 0400) Last BM Date: 05/27/14  Intake/Output from previous day:   Intake/Output this shift: Total I/O In: 240 [P.O.:240] Out: -   PE: General-- Heart-- Lungs-- Abdomen--  Lab Results:  Recent Labs  05/25/14 1116 05/26/14 0435 05/26/14 0835 05/27/14 0515  WBC 7.1 7.2 8.2 7.0  HGB 16.2 13.2 14.7 13.0  HCT 47.7 39.5 43.1 38.0*  PLT 188 189 196 199   BMET  Recent Labs  05/25/14 1116 05/26/14 0435 05/27/14 0515  NA 139 140 139  K 4.1 4.1 4.0  CL 100 102 103  CO2 22 23 25   CREATININE 1.41* 1.18 1.16   LFT  Recent Labs  05/25/14 1116  PROT 7.2  AST 19  ALT 16  ALKPHOS 78  BILITOT 0.3   PT/INR  Recent Labs  05/25/14 1642  LABPROT 13.0  INR 0.98   PANCREAS No results found for this basename: LIPASE,  in the last 72 hours       Studies/Results: No results found.  Medications: I have reviewed the patient's current medications.  Assessment/Plan: 1. Infectious diarrhea secondary to Campylobacter. Have discuss with patient he clearly feels that is stools are beginning to firm up. The recommendation for treatment is that only patients who are quite sick with colitis, thickened colon, elevated white count, fever, etc. be treated with antibiotics. Most patients with Campylobacter can be treated symptomatically. If antibiotics required, treatment of choice is as azithromycin. At this  point, it appears the patient is improving without any specific treatment. I have discussed this with him in detail.  Masey Scheiber JR,Cypress Hinkson L 05/27/2014, 3:18 PM

## 2014-05-27 NOTE — Progress Notes (Signed)
Subjective: Patient is feeling better overall but still having loose stools.  Still in atrial fibrillation with heart rate 90-100 on the monitor.  Denies any bloody stools or fevers or chills or chest pain or shortness of breath.  Appears comfortable in bed  Objective: Weight change: -2 lb 0.7 oz (-0.927 kg) No intake or output data in the 24 hours ending 05/27/14 0813 Filed Vitals:   05/26/14 2000 05/27/14 0000 05/27/14 0400 05/27/14 0753  BP: 119/77 94/61 119/80 123/88  Pulse: 77 99 88 56  Temp: 98.2 F (36.8 C) 97.8 F (36.6 C) 98.7 F (37.1 C) 98.1 F (36.7 C)  TempSrc: Oral Oral Oral Oral  Resp: 16 16 18 18   Height:      Weight:   182 lb 15.3 oz (82.988 kg)   SpO2: 98% 97% 96% 99%    General Appearance: Alert, cooperative, no distress, appears stated age Lungs: Clear to auscultation bilaterally, respirations unlabored Heart: Irregular rhythm, S1 and S2 normal, no murmur, rub or gallop Abdomen: Soft, non-tender, bowel sounds active all four quadrants, no masses, no organomegaly Extremities: Extremities normal, atraumatic, no cyanosis or edema Neuro: Oriented x3, nonfocal  Lab Results: Results for orders placed during the hospital encounter of 05/25/14 (from the past 48 hour(s))  CBC     Status: None   Collection Time    05/25/14 11:16 AM      Result Value Ref Range   WBC 7.1  4.0 - 10.5 K/uL   RBC 4.94  4.22 - 5.81 MIL/uL   Hemoglobin 16.2  13.0 - 17.0 g/dL   HCT 47.7  39.0 - 52.0 %   MCV 96.6  78.0 - 100.0 fL   MCH 32.8  26.0 - 34.0 pg   MCHC 34.0  30.0 - 36.0 g/dL   RDW 13.3  11.5 - 15.5 %   Platelets 188  150 - 400 K/uL  COMPREHENSIVE METABOLIC PANEL     Status: Abnormal   Collection Time    05/25/14 11:16 AM      Result Value Ref Range   Sodium 139  137 - 147 mEq/L   Potassium 4.1  3.7 - 5.3 mEq/L   Chloride 100  96 - 112 mEq/L   CO2 22  19 - 32 mEq/L   Glucose, Bld 117 (*) 70 - 99 mg/dL   BUN 28 (*) 6 - 23 mg/dL   Creatinine, Ser 1.41 (*) 0.50 - 1.35  mg/dL   Calcium 9.8  8.4 - 10.5 mg/dL   Total Protein 7.2  6.0 - 8.3 g/dL   Albumin 3.2 (*) 3.5 - 5.2 g/dL   AST 19  0 - 37 U/L   ALT 16  0 - 53 U/L   Alkaline Phosphatase 78  39 - 117 U/L   Total Bilirubin 0.3  0.3 - 1.2 mg/dL   GFR calc non Af Amer 50 (*) >90 mL/min   GFR calc Af Amer 58 (*) >90 mL/min   Comment: (NOTE)     The eGFR has been calculated using the CKD EPI equation.     This calculation has not been validated in all clinical situations.     eGFR's persistently <90 mL/min signify possible Chronic Kidney     Disease.  Randolm Idol, ED     Status: None   Collection Time    05/25/14 11:24 AM      Result Value Ref Range   Troponin i, poc 0.03  0.00 - 0.08 ng/mL   Comment 3  Comment: Due to the release kinetics of cTnI,     a negative result within the first hours     of the onset of symptoms does not rule out     myocardial infarction with certainty.     If myocardial infarction is still suspected,     repeat the test at appropriate intervals.  PROTIME-INR     Status: None   Collection Time    05/25/14  4:42 PM      Result Value Ref Range   Prothrombin Time 13.0  11.6 - 15.2 seconds   INR 0.98  0.00 - 1.49  TSH     Status: None   Collection Time    05/25/14  4:42 PM      Result Value Ref Range   TSH 0.960  0.350 - 4.500 uIU/mL  T4, FREE     Status: None   Collection Time    05/25/14  4:42 PM      Result Value Ref Range   Free T4 1.59  0.80 - 1.80 ng/dL   Comment: Performed at [place]     Status: None   Collection Time    05/25/14  4:42 PM      Result Value Ref Range   Magnesium 2.0  1.5 - 2.5 mg/dL  HEMOGLOBIN A1C     Status: Abnormal   Collection Time    05/25/14  4:42 PM      Result Value Ref Range   Hemoglobin A1C 5.8 (*) <5.7 %   Comment: (NOTE)                                                                               According to the ADA Clinical Practice Recommendations for 2011, when     HbA1c is used  as a screening test:      >=6.5%   Diagnostic of Diabetes Mellitus               (if abnormal result is confirmed)     5.7-6.4%   Increased risk of developing Diabetes Mellitus     References:Diagnosis and Classification of Diabetes Mellitus,Diabetes     MVHQ,4696,29(BMWUX 1):S62-S69 and Standards of Medical Care in             Diabetes - 2011,Diabetes Care,2011,34 (Suppl 1):S11-S61.   Mean Plasma Glucose 120 (*) <117 mg/dL   Comment: Performed at [facility]  CBC     Status: Abnormal   Collection Time    05/26/14  4:35 AM      Result Value Ref Range   WBC 7.2  4.0 - 10.5 K/uL   RBC 4.12 (*) 4.22 - 5.81 MIL/uL   Hemoglobin 13.2  13.0 - 17.0 g/dL   Comment: DELTA CHECK NOTED     REPEATED TO VERIFY   HCT 39.5  39.0 - 52.0 %   MCV 95.9  78.0 - 100.0 fL   MCH 32.0  26.0 - 34.0 pg   MCHC 33.4  30.0 - 36.0 g/dL   RDW 13.1  11.5 - 15.5 %   Platelets 189  150 - 400 K/uL  BASIC METABOLIC PANEL     Status:  Abnormal   Collection Time    05/26/14  4:35 AM      Result Value Ref Range   Sodium 140  137 - 147 mEq/L   Potassium 4.1  3.7 - 5.3 mEq/L   Chloride 102  96 - 112 mEq/L   CO2 23  19 - 32 mEq/L   Glucose, Bld 100 (*) 70 - 99 mg/dL   BUN 23  6 - 23 mg/dL   Creatinine, Ser 1.18  0.50 - 1.35 mg/dL   Calcium 8.8  8.4 - 10.5 mg/dL   GFR calc non Af Amer 62 (*) >90 mL/min   GFR calc Af Amer 71 (*) >90 mL/min   Comment: (NOTE)     The eGFR has been calculated using the CKD EPI equation.     This calculation has not been validated in all clinical situations.     eGFR's persistently <90 mL/min signify possible Chronic Kidney     Disease.  LIPID PANEL     Status: Abnormal   Collection Time    05/26/14  4:35 AM      Result Value Ref Range   Cholesterol 105  0 - 200 mg/dL   Triglycerides 117  <150 mg/dL   HDL 39 (*) >39 mg/dL   Total CHOL/HDL Ratio 2.7     VLDL 23  0 - 40 mg/dL   LDL Cholesterol 43  0 - 99 mg/dL   Comment:            Total Cholesterol/HDL:CHD Risk      Coronary Heart Disease Risk Table                         Men   Women      1/2 Average Risk   3.4   3.3      Average Risk       5.0   4.4      2 X Average Risk   9.6   7.1      3 X Average Risk  23.4   11.0                Use the calculated Patient Ratio     above and the CHD Risk Table     to determine the patient's CHD Risk.                ATP III CLASSIFICATION (LDL):      <100     mg/dL   Optimal      100-129  mg/dL   Near or Above                        Optimal      130-159  mg/dL   Borderline      160-189  mg/dL   High      >190     mg/dL   Very High  CBC WITH DIFFERENTIAL     Status: Abnormal   Collection Time    05/26/14  8:35 AM      Result Value Ref Range   WBC 8.2  4.0 - 10.5 K/uL   RBC 4.49  4.22 - 5.81 MIL/uL   Hemoglobin 14.7  13.0 - 17.0 g/dL   HCT 43.1  39.0 - 52.0 %   MCV 96.0  78.0 - 100.0 fL   MCH 32.7  26.0 - 34.0 pg   MCHC 34.1  30.0 - 36.0 g/dL  RDW 13.1  11.5 - 15.5 %   Platelets 196  150 - 400 K/uL   Neutrophils Relative % 61  43 - 77 %   Lymphocytes Relative 24  12 - 46 %   Monocytes Relative 13 (*) 3 - 12 %   Eosinophils Relative 1  0 - 5 %   Basophils Relative 1  0 - 1 %   Neutro Abs 4.9  1.7 - 7.7 K/uL   Lymphs Abs 2.0  0.7 - 4.0 K/uL   Monocytes Absolute 1.1 (*) 0.1 - 1.0 K/uL   Eosinophils Absolute 0.1  0.0 - 0.7 K/uL   Basophils Absolute 0.1  0.0 - 0.1 K/uL  CBC WITH DIFFERENTIAL     Status: Abnormal   Collection Time    05/27/14  5:15 AM      Result Value Ref Range   WBC 7.0  4.0 - 10.5 K/uL   RBC 4.01 (*) 4.22 - 5.81 MIL/uL   Hemoglobin 13.0  13.0 - 17.0 g/dL   HCT 38.0 (*) 39.0 - 52.0 %   MCV 94.8  78.0 - 100.0 fL   MCH 32.4  26.0 - 34.0 pg   MCHC 34.2  30.0 - 36.0 g/dL   RDW 13.1  11.5 - 15.5 %   Platelets 199  150 - 400 K/uL   Neutrophils Relative % 57  43 - 77 %   Lymphocytes Relative 28  12 - 46 %   Monocytes Relative 13 (*) 3 - 12 %   Eosinophils Relative 1  0 - 5 %   Basophils Relative 1  0 - 1 %   Neutro Abs 3.9  1.7  - 7.7 K/uL   Lymphs Abs 2.0  0.7 - 4.0 K/uL   Monocytes Absolute 0.9  0.1 - 1.0 K/uL   Eosinophils Absolute 0.1  0.0 - 0.7 K/uL   Basophils Absolute 0.1  0.0 - 0.1 K/uL   WBC Morphology TOXIC GRANULATION     Comment: ATYPICAL LYMPHOCYTES     MILD LEFT SHIFT (1-5% METAS, OCC MYELO, OCC BANDS)  BASIC METABOLIC PANEL     Status: Abnormal   Collection Time    05/27/14  5:15 AM      Result Value Ref Range   Sodium 139  137 - 147 mEq/L   Potassium 4.0  3.7 - 5.3 mEq/L   Chloride 103  96 - 112 mEq/L   CO2 25  19 - 32 mEq/L   Glucose, Bld 95  70 - 99 mg/dL   BUN 16  6 - 23 mg/dL   Creatinine, Ser 1.16  0.50 - 1.35 mg/dL   Calcium 8.8  8.4 - 10.5 mg/dL   GFR calc non Af Amer 63 (*) >90 mL/min   GFR calc Af Amer 73 (*) >90 mL/min   Comment: (NOTE)     The eGFR has been calculated using the CKD EPI equation.     This calculation has not been validated in all clinical situations.     eGFR's persistently <90 mL/min signify possible Chronic Kidney     Disease.    Studies/Results: Dg Chest 2 View  05/25/2014   CLINICAL DATA:  Hypertension and tachycardia.  EXAM: CHEST  2 VIEW  COMPARISON:  02/17/2014.  FINDINGS: The cardiac silhouette, mediastinal and hilar contours are within normal limits and stable. The lungs are clear. No pleural effusion. The bony thorax is intact.  IMPRESSION: No acute cardiopulmonary findings.   Electronically Signed   By: Elta Guadeloupe  Gallerani M.D.   On: 05/25/2014 14:28   Medications: Scheduled Meds: . apixaban  5 mg Oral BID  . atorvastatin  10 mg Oral q1800  . multivitamin with minerals  1 tablet Oral QPM  . pantoprazole  40 mg Oral Daily  . polyvinyl alcohol  1 drop Both Eyes Daily   Continuous Infusions: . diltiazem (CARDIZEM) infusion 5 mg/hr (05/25/14 1703)   PRN Meds:.acetaminophen, ondansetron (ZOFRAN) IV  Assessment/Plan:  Active Problems:  Acute gastroenteritis - stool for enteric pathogens and O&P sent and pending results- lactose free diet -  appreciate GI consult by Dr. Oletta Lamas  Atrial fibrillation and flutter - as per cardiology - discussed with Dr. Ron Parker and plan for TEE and cardioversion tomorrow.  Rate fairly well controlled.  Thyroid function normal HTN (hypertension) - controlled - takes Diovan HCT chronically  AKI (acute kidney injury) - creatinine back down to 1.16 Hyperlipemia - history of - normally on Crestor 10 milligrams daily  Prostate cancer - treated with seed implantation by Dr. Risa Grill in April, 2015, 2 months ago  Sigmoid diverticulosis - aware   LOS: 2 days   Henrine Screws, MD 05/27/2014, 8:13 AM

## 2014-05-27 NOTE — Progress Notes (Signed)
Patient ID: William Combs, male   DOB: 1946/09/12, 68 y.o.   MRN: [id number]    SUBJECTIVE:  The patient is stable today. I have spoken with Dr. Inda Merlin. His diarrhea evaluation is ongoing. His atrial fibrillation/flutter persists. Intermittently he has increased rates. He is receiving Eliquis. He he will have received his fifth dose by tonight. We can proceed with TEE cardioversion tomorrow. I have made arrangements for this and it is scheduled for 2 PM on May 28, 2014.    Filed Vitals:   05/26/14 2000 05/27/14 0000 05/27/14 0400 05/27/14 0753  BP: 119/77 94/61 119/80 123/88  Pulse: 77 99 88 56  Temp: 98.2 F (36.8 C) 97.8 F (36.6 C) 98.7 F (37.1 C) 98.1 F (36.7 C)  TempSrc: Oral Oral Oral Oral  Resp: 16 16 18 18   Height:      Weight:   182 lb 15.3 oz (82.988 kg)   SpO2: 98% 97% 96% 99%     Intake/Output Summary (Last 24 hours) at 05/27/14 0915 Last data filed at 05/27/14 0900  Gross per 24 hour  Intake    240 ml  Output      0 ml  Net    240 ml    LABS: Basic Metabolic Panel:  Recent Labs  05/25/14 1116 05/25/14 1642 05/26/14 0435 05/27/14 0515  NA 139  --  140 139  K 4.1  --  4.1 4.0  CL 100  --  102 103  CO2 22  --  23 25  GLUCOSE 117*  --  100* 95  BUN 28*  --  23 16  CREATININE 1.41*  --  1.18 1.16  CALCIUM 9.8  --  8.8 8.8  MG  --  2.0  --   --    Liver Function Tests:  Recent Labs  05/25/14 1116  AST 19  ALT 16  ALKPHOS 78  BILITOT 0.3  PROT 7.2  ALBUMIN 3.2*   No results found for this basename: LIPASE, AMYLASE,  in the last 72 hours CBC:  Recent Labs  05/26/14 0835 05/27/14 0515  WBC 8.2 7.0  NEUTROABS 4.9 3.9  HGB 14.7 13.0  HCT 43.1 38.0*  MCV 96.0 94.8  PLT 196 199   Cardiac Enzymes: No results found for this basename: CKTOTAL, CKMB, CKMBINDEX, TROPONINI,  in the last 72 hours BNP: No components found with this basename: POCBNP,  D-Dimer: No results found for this basename: DDIMER,  in the last 72 hours Hemoglobin  A1C:  Recent Labs  05/25/14 1642  HGBA1C 5.8*   Fasting Lipid Panel:  Recent Labs  05/26/14 0435  CHOL 105  HDL 39*  LDLCALC 43  TRIG 117  CHOLHDL 2.7   Thyroid Function Tests:  Recent Labs  05/25/14 1642  TSH 0.960    RADIOLOGY: Dg Chest 2 View  05/25/2014   CLINICAL DATA:  Hypertension and tachycardia.  EXAM: CHEST  2 VIEW  COMPARISON:  02/17/2014.  FINDINGS: The cardiac silhouette, mediastinal and hilar contours are within normal limits and stable. The lungs are clear. No pleural effusion. The bony thorax is intact.  IMPRESSION: No acute cardiopulmonary findings.   Electronically Signed   By: Kalman Jewels M.D.   On: 05/25/2014 14:28    PHYSICAL EXAM   patient is oriented to person time and place. Affect is normal. Lungs are clear. Respiratory effort is nonlabored. Cardiac exam reveals S1 and S2. The rhythm is irregularly irregular. There is no peripheral edema.   TELEMETRY: I have  reviewed telemetry today May 27, 2014. There is atrial fibrillation/flutter with intermittent increased rates.   ASSESSMENT AND PLAN:    Acute gastroenteritis     Evaluation is ongoing by his primary team and GI    Atrial fibrillation and flutter     He is being anticoagulated. He is scheduled for a TEE cardioversion for 2 PM on May 28, 2014.   Dola Argyle 05/27/2014 9:15 AM

## 2014-05-27 NOTE — Progress Notes (Signed)
Dr. Oletta Lamas notified of stool panel results. Stated he will be by to see patient this afternoon. No new orders received.

## 2014-05-28 ENCOUNTER — Encounter (HOSPITAL_COMMUNITY): Payer: Medicare Other | Admitting: Anesthesiology

## 2014-05-28 ENCOUNTER — Encounter (HOSPITAL_COMMUNITY): Payer: Self-pay | Admitting: Anesthesiology

## 2014-05-28 ENCOUNTER — Encounter (HOSPITAL_COMMUNITY)
Admission: EM | Disposition: A | Discharge status: Home / Self Care | Payer: Self-pay | Source: Home / Self Care | Attending: Cardiology

## 2014-05-28 ENCOUNTER — Inpatient Hospital Stay (HOSPITAL_COMMUNITY): Payer: Medicare Other | Admitting: Anesthesiology

## 2014-05-28 DIAGNOSIS — I4891 Unspecified atrial fibrillation: Secondary | ICD-10-CM

## 2014-05-28 DIAGNOSIS — A045 Campylobacter enteritis: Secondary | ICD-10-CM

## 2014-05-28 DIAGNOSIS — I359 Nonrheumatic aortic valve disorder, unspecified: Secondary | ICD-10-CM

## 2014-05-28 HISTORY — PX: CARDIOVERSION: SHX1299

## 2014-05-28 HISTORY — PX: TEE WITHOUT CARDIOVERSION: SHX5443

## 2014-05-28 SURGERY — ECHOCARDIOGRAM, TRANSESOPHAGEAL
Anesthesia: Monitor Anesthesia Care

## 2014-05-28 MED ORDER — APIXABAN 5 MG PO TABS: 5.0000 mg | ORAL_TABLET | Freq: Two times a day (BID) | ORAL | Status: DC

## 2014-05-28 MED ORDER — PROPOFOL 10 MG/ML IV BOLUS
INTRAVENOUS | Status: DC | PRN
  Administered 2014-05-28: 20 mg via INTRAVENOUS
  Administered 2014-05-28 (×5): 30 mg via INTRAVENOUS

## 2014-05-28 MED ORDER — BUTAMBEN-TETRACAINE-BENZOCAINE 2-2-14 % EX AERO
INHALATION_SPRAY | CUTANEOUS | Status: DC | PRN
  Administered 2014-05-28: 2 via TOPICAL

## 2014-05-28 MED ORDER — SACCHAROMYCES BOULARDII 250 MG PO CAPS: 250.0000 mg | ORAL_CAPSULE | Freq: Two times a day (BID) | ORAL | Status: DC

## 2014-05-28 MED ORDER — LIDOCAINE HCL (CARDIAC) 20 MG/ML IV SOLN
INTRAVENOUS | Status: DC | PRN
  Administered 2014-05-28: 40 mg via INTRAVENOUS

## 2014-05-28 MED ORDER — SODIUM CHLORIDE 0.9 % IV SOLN
INTRAVENOUS | Status: DC
  Administered 2014-05-28: 07:00:00 via INTRAVENOUS

## 2014-05-28 MED ORDER — AZITHROMYCIN 500 MG PO TABS
500.0000 mg | ORAL_TABLET | Freq: Every day | ORAL | Status: DC
  Filled 2014-05-28: qty 1

## 2014-05-28 MED ORDER — SODIUM CHLORIDE 0.9 % IJ SOLN: 3.0000 mL | INTRAMUSCULAR | Status: DC | PRN

## 2014-05-28 MED ORDER — PROPOFOL INFUSION 10 MG/ML OPTIME
INTRAVENOUS | Status: DC | PRN
  Administered 2014-05-28: 75 ug/kg/min via INTRAVENOUS

## 2014-05-28 MED ORDER — AZITHROMYCIN 500 MG PO TABS: 500.0000 mg | ORAL_TABLET | Freq: Every day | ORAL | Status: DC

## 2014-05-28 MED ORDER — SODIUM CHLORIDE 0.9 % IJ SOLN: 3.0000 mL | Freq: Two times a day (BID) | INTRAMUSCULAR | Status: DC

## 2014-05-28 MED ORDER — DILTIAZEM HCL 30 MG PO TABS
30.0000 mg | ORAL_TABLET | Freq: Four times a day (QID) | ORAL | Status: DC
  Administered 2014-05-28 (×2): 30 mg via ORAL
  Filled 2014-05-28 (×5): qty 1

## 2014-05-28 MED ORDER — SODIUM CHLORIDE 0.9 % IV SOLN: 250.0000 mL | INTRAVENOUS | Status: DC

## 2014-05-28 MED ORDER — SACCHAROMYCES BOULARDII 250 MG PO CAPS
250.0000 mg | ORAL_CAPSULE | Freq: Two times a day (BID) | ORAL | Status: DC
  Filled 2014-05-28 (×2): qty 1

## 2014-05-28 MED ORDER — LACTATED RINGERS IV SOLN
INTRAVENOUS | Status: DC | PRN
  Administered 2014-05-28: 13:00:00 via INTRAVENOUS

## 2014-05-28 MED ORDER — DILTIAZEM HCL ER COATED BEADS 120 MG PO CP24: 120.0000 mg | ORAL_CAPSULE | Freq: Every day | ORAL | Status: DC

## 2014-05-28 NOTE — Care Management Note (Signed)
    Page 1 of 1   05/28/2014     3:34:04 PM CARE MANAGEMENT NOTE 05/28/2014  Patient:  William Combs, William Combs   Account Number:  [id number]  Date Initiated:  05/25/2014  Documentation initiated by:  Marvetta Gibbons  Subjective/Objective Assessment:     Action/Plan:   Anticipated DC Date:     Anticipated DC Plan:        [place]  CM consult      Choice offered to / List presented to:             Status of service:  Completed, signed off Medicare Important Message given?  YES (If response is "NO", the following Medicare IM given date fields will be blank) Date Medicare IM given:  05/28/2014 Medicare IM given by:  GRAVES-BIGELOW,Brenisha Tsui Date Additional Medicare IM given:   Additional Medicare IM given by:    Discharge Disposition:  HOME/SELF CARE  Per UR Regulation:  Reviewed for med. necessity/level of care/duration of stay  If discussed at [place] of Stay Meetings, dates discussed:    Comments:  05-28-14 [place], RN,BSN [phone] Eliquis Card to be given to pt co pay 20.00. No further needs from CM at this time.   05/25/14- [place] RN, BSN [phone] per rep at [facility], eliquis: tier 2, $20 for 30 day supply;  NCM to follow up in am  Josem Kaufmann is required [phone]-- MD please call prior to discharge

## 2014-05-28 NOTE — Progress Notes (Signed)
  Echocardiogram 2D Echocardiogram has been performed.  Basilia Jumbo 05/28/2014, 2:45 PM

## 2014-05-28 NOTE — Discharge Summary (Signed)
Discharge Summary   Patient ID: William Combs,  MRN: [id number], DOB/AGE: June 26, 1946 68 y.o.  Admit date: 05/25/2014 Discharge date: 05/28/2014  Primary Care Provider: GATES,ROBERT NEVILL Primary Cardiologist: Dr. Aundra Dubin  Discharge Diagnoses Principal Problem:   Atrial fibrillation and flutter Active Problems:   Campylobacter diarrhea   Prostate cancer   Acute gastroenteritis   HTN (hypertension)   AKI (acute kidney injury)   Hyperlipemia   Sigmoid diverticulosis   Chronic diarrhea   Allergies No Known Allergies  Procedures  LV EF: 60% - 65%  ------------------------------------------------------------------- Indications: Atrial fibrillation - 427.31.  ------------------------------------------------------------------- History: PMH: Acute kidney injury. Prostate cancer. Risk factors: Hypertension. Dyslipidemia.  ------------------------------------------------------------------- Study Conclusions  - Left ventricle: The cavity size was normal. There was mild focal basal hypertrophy of the septum. Systolic function was normal. The estimated ejection fraction was in the range of 60% to 65%. Wall motion was normal; there were no regional wall motion abnormalities. The study was not technically sufficient to allow evaluation of LV diastolic dysfunction due to atrial fibrillation. - Aortic valve: Trileaflet; normal thickness leaflets. - Mitral valve: Structurally normal valve. There was no regurgitation. - Right atrium: The atrium was normal in size. - Tricuspid valve: There was no regurgitation. - Inferior vena cava: The vessel was normal in size. - Pericardium, extracardiac: There was no pericardial effusion.  Impressions:  - Normal biventricular size and systolic function. No significant valvular abnormality.      TEE with cardioversion 05/28/2014 no LAA thrombus noted; synchronized DCCV with 120 J resulted in sinus rhythm; no immediate complications;  continue apixaban.     Hospital Course  The patient is a 68 year old pleasant Caucasian male with no prior cardiac history but history of hypertension, hyperlipidemia, prostate cancer status post seed implant in April 2015 who presented to his PCPs office on 05/25/2014 for evaluation of diarrhea and weakness and was found to be in new onset atrial flutter. Patient was referred to [facility] for further evaluation. Upon arrival to the ED, patient was noted to have blood pressure in the low 100s. He received 1 L of fluid and 15 mg diltiazem bolus and started on diltiazem drip. He had a recurrence of atrial flutter with RVR alternating with atrial fibrillation and was given a second bolus of 20 mg diltiazem before he was rate controlled. Initial laboratory finding include Cr 1.41 and glucose 117. Serial troponin were negative. Patient states he has no cardiac awareness of atrial fibrillation or flutter, therefore total duration was unknown. He denied any chest pain, lower extremity edema, or shortness breath. His CHA2DS2-Vasc score of 2. In consideration of his active lifestyle, he was started on Eliquis 5 mg twice a day.   Patient was evaluated by GI on 6/30, GI workup include ova and stool panel were ordered which came back positive for Campylobacter diarrhea. He had symptomatic improvement without antibiotics. However considering he was still have loose stool and weakness, he was given 3 days of azithromycin and probiotics on 05/28/2014 by Internal Medicine team. Patient took 5 doses of Eliquis. He eventually underwent TEE with cardioversion on 05/28/2014. TEE showed no sign of left atrial thrombus. A single 120 J DC current was delivered resulting in normal sinus rhythm. Patient was evaluated upon arrival to the floor 2 hours after the procedure. At which time he denies any chest discomfort, dizziness, nausea, vomiting, or shortness of breath. Telemetry continued to show normal sinus rhythm with heart  rate in the 70s to 80s range. He is deemed  stable for discharge from cardiology perspective. His diltiazem will be changed to 120mg  long acting cardizem. Follow up has been scheduled with both Dr. Claris Gladden PA in the office and pharmacist as patient is new to eliquis. He will be given a 30 day free coupon for eliquis.     Discharge Vitals Blood pressure 136/87, pulse 70, temperature 98.6 F (37 C), temperature source Oral, resp. rate 13, height 5\' 11"  (1.803 m), weight 182 lb 15.3 oz (82.988 kg), SpO2 99.00%.  Filed Weights   05/25/14 1528 05/26/14 0400 05/27/14 0400  Weight: 185 lb (83.915 kg) 183 lb (83.008 kg) 182 lb 15.3 oz (82.988 kg)    Labs  CBC  Recent Labs  05/26/14 0835 05/27/14 0515  WBC 8.2 7.0  NEUTROABS 4.9 3.9  HGB 14.7 13.0  HCT 43.1 38.0*  MCV 96.0 94.8  PLT 196 702   Basic Metabolic Panel  Recent Labs  05/25/14 1642 05/26/14 0435 05/27/14 0515  NA  --  140 139  K  --  4.1 4.0  CL  --  102 103  CO2  --  23 25  GLUCOSE  --  100* 95  BUN  --  23 16  CREATININE  --  1.18 1.16  CALCIUM  --  8.8 8.8  MG 2.0  --   --    Hemoglobin A1C  Recent Labs  05/25/14 1642  HGBA1C 5.8*   Fasting Lipid Panel  Recent Labs  05/26/14 0435  CHOL 105  HDL 39*  LDLCALC 43  TRIG 117  CHOLHDL 2.7   Thyroid Function Tests  Recent Labs  05/25/14 1642  TSH 0.960    Disposition  Pt is being discharged home today in good condition.  Follow-up Plans & Appointments      Follow-up Information   Follow up with [facility] On 06/15/2014. (10:30am Newly placed on coumadin. Pharmacist education)    Contact information:   [address] [place] [address]       Follow up with Richardson Dopp, PA-C On 07/02/2014. (11:30am )    Specialty:  Physician Assistant   Contact information:   [address] [place] [address] [phone]       Please follow up. (Patient to schedule follow up with PCP Dr. Inda Merlin)         Discharge Medications    Medication List    STOP taking these medications       aspirin EC 81 MG tablet     valsartan-hydrochlorothiazide 160-25 MG per tablet  Commonly known as:  DIOVAN-HCT      TAKE these medications       apixaban 5 MG Tabs tablet  Commonly known as:  ELIQUIS  Take 1 tablet (5 mg total) by mouth 2 (two) times daily.     apixaban 5 MG Tabs tablet  Commonly known as:  ELIQUIS  Take 1 tablet (5 mg total) by mouth 2 (two) times daily. To use after finish 30 day free eliquis with coupon card     azithromycin 500 MG tablet  Commonly known as:  ZITHROMAX  Take 1 tablet (500 mg total) by mouth daily.     diltiazem 120 MG 24 hr capsule  Commonly known as:  CARDIZEM CD  Take 1 capsule (120 mg total) by mouth daily.     hydroxypropyl methylcellulose 2.5 % ophthalmic solution  Commonly known as:  ISOPTO TEARS  Place 1 drop into both eyes daily.  multivitamin with minerals Tabs tablet  Take 1 tablet by mouth every evening.     rosuvastatin 10 MG tablet  Commonly known as:  CRESTOR  Take 5 mg by mouth every evening.     saccharomyces boulardii 250 MG capsule  Commonly known as:  FLORASTOR  Take 1 capsule (250 mg total) by mouth 2 (two) times daily.         Duration of Discharge Encounter   Greater than 30 minutes including physician time.  Hilbert Corrigan PA-C 05/28/2014, 4:28 PM Patient seen and examined. I agree with the assessment and plan as detailed above. See also my additional thoughts below.   Patient is diarrhea is improving. He was converted to sinus rhythm. Hopefully this will hold. He is anticoagulated. He'll be seen back by our team for followup. I made the decision for him to go home.  Dola Argyle, MD, [facility] 05/28/2014 4:49 PM

## 2014-05-28 NOTE — Anesthesia Preprocedure Evaluation (Addendum)
Anesthesia Evaluation  Patient identified by MRN, date of birth, ID band Patient awake    Reviewed: Allergy & Precautions, H&P , NPO status , Patient's Chart, lab work & pertinent test results  History of Anesthesia Complications Negative for: history of anesthetic complications  Airway Mallampati: II TM Distance: >3 FB Neck ROM: Full    Dental  (+) Teeth Intact, Dental Advisory Given   Pulmonary neg pulmonary ROS,  breath sounds clear to auscultation        Cardiovascular hypertension, Pt. on medications Rhythm:Irregular Rate:Normal     Neuro/Psych negative neurological ROS  negative psych ROS   GI/Hepatic negative GI ROS, Neg liver ROS,   Endo/Other    Renal/GU Renal disease     Musculoskeletal   Abdominal   Peds  Hematology   Anesthesia Other Findings   Reproductive/Obstetrics                          Anesthesia Physical Anesthesia Plan  ASA: III  Anesthesia Plan: MAC   Post-op Pain Management:    Induction: Intravenous  Airway Management Planned: Mask  Additional Equipment:   Intra-op Plan:   Post-operative Plan:   Informed Consent: I have reviewed the patients History and Physical, chart, labs and discussed the procedure including the risks, benefits and alternatives for the proposed anesthesia with the patient or authorized representative who has indicated his/her understanding and acceptance.   Dental advisory given  Plan Discussed with: CRNA and Anesthesiologist  Anesthesia Plan Comments:         Anesthesia Quick Evaluation

## 2014-05-28 NOTE — Progress Notes (Signed)
Patient Name: William Combs Date of Encounter: 05/28/2014     Active Problems:   Prostate cancer   Acute gastroenteritis   Atrial fibrillation and flutter   HTN (hypertension)   AKI (acute kidney injury)   Hyperlipemia   Sigmoid diverticulosis   Chronic diarrhea   Atrial fibrillation    SUBJECTIVE  Denies any chest discomfort or SOB. His diarrhea is improving, stating he had more this morning, however the stool is more solid and forming little pellets. Denies any fever or chill.  CURRENT MEDS . apixaban  5 mg Oral BID  . atorvastatin  10 mg Oral q1800  . multivitamin with minerals  1 tablet Oral QPM  . pantoprazole  40 mg Oral Daily  . polyvinyl alcohol  1 drop Both Eyes Daily    OBJECTIVE  Filed Vitals:   05/27/14 0753 05/27/14 1356 05/27/14 2125 05/28/14 0642  BP: 123/88 117/84 115/79 112/70  Pulse: 56 77 76 74  Temp: 98.1 F (36.7 C) 98.4 F (36.9 C) 98.5 F (36.9 C) 98 F (36.7 C)  TempSrc: Oral Oral Oral Oral  Resp: 18 18 18 18   Height:      Weight:      SpO2: 99% 99% 98% 97%    Intake/Output Summary (Last 24 hours) at 05/28/14 0745 Last data filed at 05/27/14 0900  Gross per 24 hour  Intake    240 ml  Output      0 ml  Net    240 ml   Filed Weights   05/25/14 1528 05/26/14 0400 05/27/14 0400  Weight: 185 lb (83.915 kg) 183 lb (83.008 kg) 182 lb 15.3 oz (82.988 kg)    PHYSICAL EXAM  General: Pleasant, NAD. Neuro: Alert and oriented X 3. Moves all extremities spontaneously. Psych: Normal affect. HEENT:  Normal  Neck: Supple without bruits or JVD. Lungs:  Resp regular and unlabored, CTA. Heart: irregular  no s3, s4, or murmurs. Abdomen: Soft, non-tender, non-distended, BS + x 4.  Extremities: No clubbing, cyanosis or edema. DP/PT/Radials 2+ and equal bilaterally.  Accessory Clinical Findings  CBC  Recent Labs  05/26/14 0835 05/27/14 0515  WBC 8.2 7.0  NEUTROABS 4.9 3.9  HGB 14.7 13.0  HCT 43.1 38.0*  MCV 96.0 94.8  PLT 196  902   Basic Metabolic Panel  Recent Labs  05/25/14 1116 05/25/14 1642 05/26/14 0435 05/27/14 0515  NA 139  --  140 139  K 4.1  --  4.1 4.0  CL 100  --  102 103  CO2 22  --  23 25  GLUCOSE 117*  --  100* 95  BUN 28*  --  23 16  CREATININE 1.41*  --  1.18 1.16  CALCIUM 9.8  --  8.8 8.8  MG  --  2.0  --   --    Liver Function Tests  Recent Labs  05/25/14 1116  AST 19  ALT 16  ALKPHOS 78  BILITOT 0.3  PROT 7.2  ALBUMIN 3.2*   Hemoglobin A1C  Recent Labs  05/25/14 1642  HGBA1C 5.8*   Fasting Lipid Panel  Recent Labs  05/26/14 0435  CHOL 105  HDL 39*  LDLCALC 43  TRIG 117  CHOLHDL 2.7   Thyroid Function Tests  Recent Labs  05/25/14 1642  TSH 0.960    TELE  A-fib/a-flutter with HR 90-120s between 8:00pm and 10:00pm last night, eventually came down after 10 pm, overnight has been in 70-80s range with occasional spikes.   ECG  A-fib with HR 90s.  Radiology/Studies  Dg Chest 2 View  05/25/2014   CLINICAL DATA:  Hypertension and tachycardia.  EXAM: CHEST  2 VIEW  COMPARISON:  02/17/2014.  FINDINGS: The cardiac silhouette, mediastinal and hilar contours are within normal limits and stable. The lungs are clear. No pleural effusion. The bony thorax is intact.  IMPRESSION: No acute cardiopulmonary findings.   Electronically Signed   By: Kalman Jewels M.D.   On: 05/25/2014 14:28    ASSESSMENT AND PLAN  1. A-flutter/a-fib: presented with a-flutter, converted to a-fib after dilt bolus  - unknown duration, may be exacerbated by recent diarrhea and electrolyte shift  - TSH and free T4 normal  - CHA2DS2-Vasc score of 2 (age and HTN)  - Echo EF 60-65%, no valvular abnormalities - completed 5th dose of eliquis yesterday, planning for TEE with cardioversion today - d/c IV diltiazem, change to 30mg  Q6hr PO cardizem, will transition to long acting on discharge  2. HTN   3. Hyperlipidemia   4. Prostate cancer s/p seed implant 02/2014   5. Acute on chronic  renal insufficiency  - Cr back to baseline  6. Diarrhea since last Fri  - GI following, positive for campylobacter - per GI, no azithromycin unless febrile, wbc up or colitis, patient improving, recommendation symptomatic management only  - since patient is still having symptom, IM will give 3 days of azithromycin (prescription in chart)   Signed, Almyra Deforest PA-C Pager: [phone] Patient seen and examined. I agree with the assessment and plan as detailed above. See also my additional thoughts below.   The patient is stable. GI evaluation is complete and plans are in place. He is for TEE cardioversion this afternoon. After he recovers from this completely we will be able to discharge him home. His followup will be with his primary physician Dr. Inda Merlin and his cardiologist Dr. Aundra Dubin.  Dola Argyle, MD, [facility] 05/28/2014 9:15 AM

## 2014-05-28 NOTE — Transfer of Care (Signed)
Immediate Anesthesia Transfer of Care Note  Patient: William Combs  Procedure(s) Performed: Procedure(s): TRANSESOPHAGEAL ECHOCARDIOGRAM (TEE) (N/A) CARDIOVERSION (N/A)  Patient Location: Endoscopy Unit  Anesthesia Type:MAC  Level of Consciousness: awake, alert , oriented and patient cooperative  Airway & Oxygen Therapy: Patient Spontanous Breathing and Patient connected to nasal cannula oxygen  Post-op Assessment: Report given to PACU RN and Post -op Vital signs reviewed and stable  Post vital signs: Reviewed  Complications: No apparent anesthesia complications

## 2014-05-28 NOTE — Progress Notes (Signed)
[facility] PROGRESS NOTE Subjective Stools still loose no worse but no better.  Objective: Vital signs in last 24 hours: Temp:  [98 F (36.7 C)-98.5 F (36.9 C)] 98 F (36.7 C) (07/02 0642) Pulse Rate:  [74-77] 74 (07/02 0642) Resp:  [18] 18 (07/02 0642) BP: (112-117)/(70-84) 112/70 mmHg (07/02 0642) SpO2:  [97 %-99 %] 97 % (07/02 0642) Last BM Date: 05/27/14  Intake/Output from previous day: 07/01 0701 - 07/02 0700 In: 240 [P.O.:240] Out: -  Intake/Output this shift:      Lab Results:  Recent Labs  05/26/14 0435 05/26/14 0835 05/27/14 0515  WBC 7.2 8.2 7.0  HGB 13.2 14.7 13.0  HCT 39.5 43.1 38.0*  PLT 189 196 199   BMET  Recent Labs  05/26/14 0435 05/27/14 0515  NA 140 139  K 4.1 4.0  CL 102 103  CO2 23 25  CREATININE 1.18 1.16   LFT No results found for this basename: PROT, AST, ALT, ALKPHOS, BILITOT, BILIDIR, IBILI,  in the last 72 hours PT/INR  Recent Labs  05/25/14 1642  LABPROT 13.0  INR 0.98   PANCREAS No results found for this basename: LIPASE,  in the last 72 hours       Studies/Results: No results found.  Medications: I have reviewed the patient's current medications.  Assessment/Plan: 1. Infectious Diarrhea with Campylobacter. Agree with treatment since he is planning to leave on trip and AB is fairly benign. Will see again as needed.    Francie Keeling JR,Jonna Dittrich L 05/28/2014, 12:31 PM

## 2014-05-28 NOTE — CV Procedure (Cosign Needed)
See full TEE report in [employer]; no LAA thrombus noted; synchronized DCCV with 120 J resulted in sinus rhythm; no immediate complications; continue apixaban. Kirk Ruths

## 2014-05-28 NOTE — Progress Notes (Signed)
Subjective: [name] continues to feel fine except he is having loose stools still.  White cells with toxic granulations yesterday.  Will go ahead and treat with azithromycin for 3 days and add Florastor for 2 weeks.  He can complete this therapy as an outpatient if he is discharged later today.  I'll plan to see him back in the office in one week.  Appreciate cardiology followup on A. fib.  Plan is for cardioversion this afternoon  Objective: Weight change:   Intake/Output Summary (Last 24 hours) at 05/28/14 0810 Last data filed at 05/27/14 0900  Gross per 24 hour  Intake    240 ml  Output      0 ml  Net    240 ml   Filed Vitals:   05/27/14 0753 05/27/14 1356 05/27/14 2125 05/28/14 0642  BP: 123/88 117/84 115/79 112/70  Pulse: 56 77 76 74  Temp: 98.1 F (36.7 C) 98.4 F (36.9 C) 98.5 F (36.9 C) 98 F (36.7 C)  TempSrc: Oral Oral Oral Oral  Resp: 18 18 18 18   Height:      Weight:      SpO2: 99% 99% 98% 97%    General Appearance: Alert, cooperative, no distress, appears stated age  Lungs: Clear to auscultation bilaterally, respirations unlabored  Heart: Irregular rhythm, S1 and S2 normal, no murmur, rub or gallop.  Rate fairly well controlled 90-100  Abdomen: Soft, non-tender, bowel sounds active all four quadrants, no masses, no organomegaly  Extremities: Extremities normal, atraumatic, no cyanosis or edema  Neuro: Oriented x3, nonfocal  Lab Results: Results for orders placed during the hospital encounter of 05/25/14 (from the past 48 hour(s))  CBC WITH DIFFERENTIAL     Status: Abnormal   Collection Time    05/26/14  8:35 AM      Result Value Ref Range   WBC 8.2  4.0 - 10.5 K/uL   RBC 4.49  4.22 - 5.81 MIL/uL   Hemoglobin 14.7  13.0 - 17.0 g/dL   HCT 43.1  39.0 - 52.0 %   MCV 96.0  78.0 - 100.0 fL   MCH 32.7  26.0 - 34.0 pg   MCHC 34.1  30.0 - 36.0 g/dL   RDW 13.1  11.5 - 15.5 %   Platelets 196  150 - 400 K/uL   Neutrophils Relative % 61  43 - 77 %   Lymphocytes Relative 24  12 - 46 %   Monocytes Relative 13 (*) 3 - 12 %   Eosinophils Relative 1  0 - 5 %   Basophils Relative 1  0 - 1 %   Neutro Abs 4.9  1.7 - 7.7 K/uL   Lymphs Abs 2.0  0.7 - 4.0 K/uL   Monocytes Absolute 1.1 (*) 0.1 - 1.0 K/uL   Eosinophils Absolute 0.1  0.0 - 0.7 K/uL   Basophils Absolute 0.1  0.0 - 0.1 K/uL  OVA AND PARASITE EXAMINATION     Status: None   Collection Time    05/26/14  1:43 PM      Result Value Ref Range   Specimen Description STOOL PERIRECTAL     Special Requests NONE     Ova and parasites       Value: NO OVA OR PARASITES SEEN ABUNDANT Y MODERATE WBC     Performed at [facility]   Report Status 05/27/2014 FINAL    CBC WITH DIFFERENTIAL     Status: Abnormal   Collection Time  05/27/14  5:15 AM      Result Value Ref Range   WBC 7.0  4.0 - 10.5 K/uL   RBC 4.01 (*) 4.22 - 5.81 MIL/uL   Hemoglobin 13.0  13.0 - 17.0 g/dL   HCT 38.0 (*) 39.0 - 52.0 %   MCV 94.8  78.0 - 100.0 fL   MCH 32.4  26.0 - 34.0 pg   MCHC 34.2  30.0 - 36.0 g/dL   RDW 13.1  11.5 - 15.5 %   Platelets 199  150 - 400 K/uL   Neutrophils Relative % 57  43 - 77 %   Lymphocytes Relative 28  12 - 46 %   Monocytes Relative 13 (*) 3 - 12 %   Eosinophils Relative 1  0 - 5 %   Basophils Relative 1  0 - 1 %   Neutro Abs 3.9  1.7 - 7.7 K/uL   Lymphs Abs 2.0  0.7 - 4.0 K/uL   Monocytes Absolute 0.9  0.1 - 1.0 K/uL   Eosinophils Absolute 0.1  0.0 - 0.7 K/uL   Basophils Absolute 0.1  0.0 - 0.1 K/uL   WBC Morphology TOXIC GRANULATION     Comment: ATYPICAL LYMPHOCYTES     MILD LEFT SHIFT (1-5% METAS, OCC MYELO, OCC BANDS)  BASIC METABOLIC PANEL     Status: Abnormal   Collection Time    05/27/14  5:15 AM      Result Value Ref Range   Sodium 139  137 - 147 mEq/L   Potassium 4.0  3.7 - 5.3 mEq/L   Chloride 103  96 - 112 mEq/L   CO2 25  19 - 32 mEq/L   Glucose, Bld 95  70 - 99 mg/dL   BUN 16  6 - 23 mg/dL   Creatinine, Ser 1.16  0.50 - 1.35 mg/dL   Calcium 8.8  8.4 -  10.5 mg/dL   GFR calc non Af Amer 63 (*) >90 mL/min   GFR calc Af Amer 73 (*) >90 mL/min   Comment: (NOTE)     The eGFR has been calculated using the CKD EPI equation.     This calculation has not been validated in all clinical situations.     eGFR's persistently <90 mL/min signify possible Chronic Kidney     Disease.    Studies/Results: No results found. Medications: Scheduled Meds: . apixaban  5 mg Oral BID  . atorvastatin  10 mg Oral q1800  . diltiazem  30 mg Oral 4 times per day  . multivitamin with minerals  1 tablet Oral QPM  . pantoprazole  40 mg Oral Daily  . polyvinyl alcohol  1 drop Both Eyes Daily   Continuous Infusions: . sodium chloride 100 mL/hr at 05/28/14 0630   PRN Meds:.acetaminophen, ondansetron (ZOFRAN) IV  Assessment/Plan:  Active Problems:  Acute gastroenteritis - stool for enteric pathogens and O&P sent and pending results- lactose free diet - appreciate GI consult by Dr. Oletta Lamas.  Still having loose stools and white cells with toxic granulations yesterday.  Will go ahead and treat with azithromycin 500 milligrams daily for 3 days orally and add Florastor for 2 weeks  Atrial fibrillation and flutter - as per cardiology - discussed with Dr. Ron Parker and plan for TEE and cardioversion today. Rate fairly well controlled. Thyroid function normal  HTN (hypertension) - controlled - takes Diovan HCT chronically  AKI (acute kidney injury) - creatinine back down to 1.16  Hyperlipemia - history of - normally on Crestor 10  milligrams daily  Prostate cancer - treated with seed implantation by Dr. Risa Grill in April, 2015, 2 months ago  Sigmoid diverticulosis - aware    LOS: 3 days   Henrine Screws, MD 05/28/2014, 8:10 AM

## 2014-05-29 ENCOUNTER — Encounter (HOSPITAL_COMMUNITY): Payer: Self-pay | Admitting: Cardiology

## 2014-06-01 NOTE — Anesthesia Postprocedure Evaluation (Signed)
  Anesthesia Post-op Note  Patient: William Combs  Procedure(s) Performed: Procedure(s): TRANSESOPHAGEAL ECHOCARDIOGRAM (TEE) (N/A) CARDIOVERSION (N/A)  Patient Location: PACU and Endoscopy Unit  Anesthesia Type:MAC  Level of Consciousness: awake  Airway and Oxygen Therapy: Patient Spontanous Breathing  Post-op Pain: mild  Post-op Assessment: Post-op Vital signs reviewed  Post-op Vital Signs: Reviewed  Last Vitals:  Filed Vitals:   05/28/14 1528  BP: 136/87  Pulse:   Temp:   Resp:     Complications: No apparent anesthesia complications

## 2014-06-07 ENCOUNTER — Inpatient Hospital Stay (HOSPITAL_COMMUNITY)
Admission: EM | Admit: 2014-06-07 | Discharge: 2014-06-09 | DRG: 310 | Disposition: A | Discharge status: Home / Self Care | Payer: Medicare Other | Attending: Cardiology | Admitting: Cardiology

## 2014-06-07 ENCOUNTER — Emergency Department (HOSPITAL_COMMUNITY): Payer: Medicare Other

## 2014-06-07 ENCOUNTER — Encounter (HOSPITAL_COMMUNITY): Payer: Self-pay | Admitting: Emergency Medicine

## 2014-06-07 DIAGNOSIS — I4891 Unspecified atrial fibrillation: Secondary | ICD-10-CM | POA: Diagnosis present

## 2014-06-07 DIAGNOSIS — C61 Malignant neoplasm of prostate: Secondary | ICD-10-CM | POA: Diagnosis present

## 2014-06-07 DIAGNOSIS — G4733 Obstructive sleep apnea (adult) (pediatric): Secondary | ICD-10-CM | POA: Diagnosis present

## 2014-06-07 DIAGNOSIS — Z91199 Patient's noncompliance with other medical treatment and regimen due to unspecified reason: Secondary | ICD-10-CM

## 2014-06-07 DIAGNOSIS — I4892 Unspecified atrial flutter: Principal | ICD-10-CM | POA: Diagnosis present

## 2014-06-07 DIAGNOSIS — M129 Arthropathy, unspecified: Secondary | ICD-10-CM | POA: Diagnosis present

## 2014-06-07 DIAGNOSIS — Z9119 Patient's noncompliance with other medical treatment and regimen: Secondary | ICD-10-CM

## 2014-06-07 DIAGNOSIS — I483 Typical atrial flutter: Secondary | ICD-10-CM

## 2014-06-07 DIAGNOSIS — Z79899 Other long term (current) drug therapy: Secondary | ICD-10-CM

## 2014-06-07 DIAGNOSIS — I480 Paroxysmal atrial fibrillation: Secondary | ICD-10-CM

## 2014-06-07 DIAGNOSIS — Z8546 Personal history of malignant neoplasm of prostate: Secondary | ICD-10-CM

## 2014-06-07 DIAGNOSIS — Z7901 Long term (current) use of anticoagulants: Secondary | ICD-10-CM

## 2014-06-07 DIAGNOSIS — E785 Hyperlipidemia, unspecified: Secondary | ICD-10-CM | POA: Diagnosis present

## 2014-06-07 DIAGNOSIS — I119 Hypertensive heart disease without heart failure: Secondary | ICD-10-CM | POA: Diagnosis present

## 2014-06-07 DIAGNOSIS — R350 Frequency of micturition: Secondary | ICD-10-CM | POA: Diagnosis present

## 2014-06-07 LAB — CBC
HEMATOCRIT: 42.9 % (ref 39.0–52.0)
HEMOGLOBIN: 14.4 g/dL (ref 13.0–17.0)
MCH: 32.7 pg (ref 26.0–34.0)
MCHC: 33.6 g/dL (ref 30.0–36.0)
MCV: 97.5 fL (ref 78.0–100.0)
Platelets: 301 10*3/uL (ref 150–400)
RBC: 4.4 MIL/uL (ref 4.22–5.81)
RDW: 13.4 % (ref 11.5–15.5)
WBC: 10 10*3/uL (ref 4.0–10.5)

## 2014-06-07 LAB — URINALYSIS, ROUTINE W REFLEX MICROSCOPIC
Bilirubin Urine: NEGATIVE
GLUCOSE, UA: NEGATIVE mg/dL
HGB URINE DIPSTICK: NEGATIVE
KETONES UR: NEGATIVE mg/dL
Leukocytes, UA: NEGATIVE
Nitrite: NEGATIVE
PH: 5 (ref 5.0–8.0)
PROTEIN: NEGATIVE mg/dL
Specific Gravity, Urine: 1.02 (ref 1.005–1.030)
Urobilinogen, UA: 0.2 mg/dL (ref 0.0–1.0)

## 2014-06-07 LAB — PROTIME-INR
INR: 1.02 (ref 0.00–1.49)
PROTHROMBIN TIME: 13.4 s (ref 11.6–15.2)

## 2014-06-07 LAB — COMPREHENSIVE METABOLIC PANEL
ALT: 25 U/L (ref 0–53)
ANION GAP: 14 (ref 5–15)
AST: 24 U/L (ref 0–37)
Albumin: 3.8 g/dL (ref 3.5–5.2)
Alkaline Phosphatase: 99 U/L (ref 39–117)
BUN: 20 mg/dL (ref 6–23)
CO2: 27 meq/L (ref 19–32)
CREATININE: 1.15 mg/dL (ref 0.50–1.35)
Calcium: 10 mg/dL (ref 8.4–10.5)
Chloride: 102 mEq/L (ref 96–112)
GFR calc Af Amer: 74 mL/min — ABNORMAL LOW (ref >90)
GFR, EST NON AFRICAN AMERICAN: 64 mL/min — AB (ref >90)
Glucose, Bld: 106 mg/dL — ABNORMAL HIGH (ref 70–99)
Potassium: 4.5 mEq/L (ref 3.7–5.3)
SODIUM: 143 meq/L (ref 137–147)
TOTAL PROTEIN: 7.4 g/dL (ref 6.0–8.3)
Total Bilirubin: 0.5 mg/dL (ref 0.3–1.2)

## 2014-06-07 LAB — APTT: APTT: 36 s (ref 24–37)

## 2014-06-07 LAB — I-STAT TROPONIN, ED: Troponin i, poc: 0.01 ng/mL (ref 0.00–0.08)

## 2014-06-07 LAB — TROPONIN I: Troponin I: <0.3 ng/mL (ref <0.30)

## 2014-06-07 MED ORDER — ATORVASTATIN CALCIUM 10 MG PO TABS
10.0000 mg | ORAL_TABLET | Freq: Every day | ORAL | Status: DC
  Administered 2014-06-08: 10 mg via ORAL
  Filled 2014-06-07 (×2): qty 1

## 2014-06-07 MED ORDER — ONDANSETRON HCL 4 MG/2ML IJ SOLN: 4.0000 mg | Freq: Four times a day (QID) | INTRAMUSCULAR | Status: DC | PRN

## 2014-06-07 MED ORDER — OFF THE BEAT BOOK
Freq: Once | Status: DC
  Filled 2014-06-07: qty 1

## 2014-06-07 MED ORDER — HYPROMELLOSE (GONIOSCOPIC) 2.5 % OP SOLN: 1.0000 [drp] | Freq: Every day | OPHTHALMIC | Status: DC

## 2014-06-07 MED ORDER — ADULT MULTIVITAMIN W/MINERALS CH
1.0000 | ORAL_TABLET | Freq: Every evening | ORAL | Status: DC
  Administered 2014-06-08: 1 via ORAL
  Filled 2014-06-07 (×2): qty 1

## 2014-06-07 MED ORDER — POLYVINYL ALCOHOL 1.4 % OP SOLN
1.0000 [drp] | Freq: Every day | OPHTHALMIC | Status: DC
  Administered 2014-06-08 – 2014-06-09 (×2): 1 [drp] via OPHTHALMIC
  Filled 2014-06-07: qty 15

## 2014-06-07 MED ORDER — APIXABAN 5 MG PO TABS
5.0000 mg | ORAL_TABLET | Freq: Two times a day (BID) | ORAL | Status: DC
  Administered 2014-06-07 – 2014-06-09 (×4): 5 mg via ORAL
  Filled 2014-06-07 (×5): qty 1

## 2014-06-07 MED ORDER — ACETAMINOPHEN 325 MG PO TABS: 650.0000 mg | ORAL_TABLET | ORAL | Status: DC | PRN

## 2014-06-07 MED ORDER — DILTIAZEM HCL 100 MG IV SOLR
5.0000 mg/h | INTRAVENOUS | Status: DC
  Administered 2014-06-07 – 2014-06-08 (×3): 5 mg/h via INTRAVENOUS
  Filled 2014-06-07 (×2): qty 100

## 2014-06-07 MED ORDER — DILTIAZEM LOAD VIA INFUSION
10.0000 mg | Freq: Once | INTRAVENOUS | Status: AC
  Administered 2014-06-07: 10 mg via INTRAVENOUS
  Filled 2014-06-07: qty 10

## 2014-06-07 NOTE — ED Notes (Signed)
Pt. Stated, I decided to only take my heart medication once a day instead of twice because of the excessful peeing.  I was out in [employer] and got back home last night around midnight.. On vacation I was very active, hiking, swimming. And it obviously put me back into A-Fib.

## 2014-06-07 NOTE — ED Provider Notes (Signed)
CSN: [id number]     Arrival date & time 06/07/14  1253 History   First MD Initiated Contact with Patient 06/07/14 1329     Chief Complaint  Patient presents with  . Atrial Fibrillation  . Weakness     (Consider location/radiation/quality/duration/timing/severity/associated sxs/prior Treatment) The history is provided by the patient. No language interpreter was used.  William Combs is a 68 y/o M with PMhx of HTN, BPH, prostate cancer, HLD, Afib, Campylobacter diarrhea presenting to the ED with Afib. Patient reported that he just returned from [employer] this weekend - reported that he arrived there on Monday. Stated that he stopped taking his Eliquis BID due to thinking this medication was what led him to have increased urination. Patient reported that he has not been taking his Eliquis BID for the past week. Patient reported that on Thursday he started not to feel well, reported that he started to feel weak. Stated that when he came back from [employer] [facility] yesterday he started to feel light-headed and not well. Patient reported that he stopped at a [facility] today and was reported to be in Afib. Patient reported that he immediately came to the ED. Denied chest pain, short of breath, difficulty breathing, syncope, numbness, tingling, nausea, vomiting, vomiting, neck pain. PCP Dr. Inda Merlin Cardiology Dr. Ron Parker   Past Medical History  Diagnosis Date  . Hypertension   . BPH (benign prostatic hyperplasia)   . Hyperlipidemia   . Prostate cancer DX  12/16/13    gleason 3+3=6, volume 48 gm, s/p seed implant 02/2014.  Marland Kitchen Arthritis     JOINTS  . History of asthma     EXERCISE INDUCED ASTHMA  1996 FOR 6 MONTHS  . Atrial fibrillation     a. 05/25/14 in the setting of campylobacter infection and diarrhea, TEE w/ cardioversion on 7/2. on eliquis and diltiazem  . Campylobacter diarrhea     a. 05/25/14   Past Surgical History  Procedure Laterality Date  . Knee arthroscopy  09/25/2012    Procedure:  ARTHROSCOPY KNEE;  Surgeon: Gearlean Alf, MD;  Location: [facility];  Service: Orthopedics;  Laterality: Left;  with medial and lateral meniscus Debridement  . Prostate biopsy  12/16/13    Gleason 6, volume 48 gm  . Right knee surgery  1975  . Radioactive seed implant N/A 03/18/2014    Procedure: RADIOACTIVE SEED IMPLANT;  Surgeon: [name], MD;  Location: [facility];  Service: Urology;  Laterality: N/A;  . Tee without cardioversion N/A 05/28/2014    Procedure: TRANSESOPHAGEAL ECHOCARDIOGRAM (TEE);  Surgeon: Lelon Perla, MD;  Location: [facility] ENDOSCOPY;  Service: Cardiovascular;  Laterality: N/A;  . Cardioversion N/A 05/28/2014    Procedure: CARDIOVERSION;  Surgeon: Lelon Perla, MD;  Location: [facility] ENDOSCOPY;  Service: Cardiovascular;  Laterality: N/A;   Family History  Problem Relation Age of Onset  . Dementia Mother   . Parkinson's disease Father   . Heart disease Brother     Older brother had some sort of rhythm procedure, is now active afterwards   History  Substance Use Topics  . Smoking status: Never Smoker   . Smokeless tobacco: Never Used  . Alcohol Use: 7.0 oz/week    14 drink(s) per week     Comment: 2-3 DRINKS DAILY (standard size)    Review of Systems  Constitutional: Negative for fever and chills.  Respiratory: Negative for chest tightness and shortness of breath.   Cardiovascular: Negative for chest pain.  Gastrointestinal: Negative for nausea, vomiting and abdominal pain.  Neurological: Positive for weakness. Negative for dizziness and headaches.      Allergies  Review of patient's allergies indicates no known allergies.  Home Medications   Prior to Admission medications   Medication Sig Start Date End Date Taking? Authorizing Provider  apixaban (ELIQUIS) 5 MG TABS tablet Take 5 mg by mouth 2 (two) times daily.   Yes Historical Provider, MD  diltiazem (CARDIZEM CD) 120 MG 24 hr capsule Take 1 capsule (120 mg total) by mouth daily. 05/28/14   Yes Almyra Deforest, PA  hydroxypropyl methylcellulose (ISOPTO TEARS) 2.5 % ophthalmic solution Place 1 drop into both eyes daily.   Yes Historical Provider, MD  Multiple Vitamin (MULTIVITAMIN WITH MINERALS) TABS Take 1 tablet by mouth every evening.    Yes Historical Provider, MD  rosuvastatin (CRESTOR) 10 MG tablet Take 5 mg by mouth every evening.   Yes Historical Provider, MD  valsartan-hydrochlorothiazide (DIOVAN-HCT) 160-25 MG per tablet Take 0.5 tablets by mouth daily.    Yes Historical Provider, MD   BP 100/72  Pulse 73  Temp(Src) 98.5 F (36.9 C) (Oral)  Resp 20  SpO2 99% Physical Exam  Nursing note and vitals reviewed. Constitutional: He is oriented to person, place, and time. He appears well-developed and well-nourished. No distress.  HENT:  Head: Normocephalic and atraumatic.  Mouth/Throat: Oropharynx is clear and moist. No oropharyngeal exudate.  Eyes: Conjunctivae and EOM are normal. Pupils are equal, round, and reactive to light. Right eye exhibits no discharge. Left eye exhibits no discharge.  Neck: Normal range of motion. Neck supple. No tracheal deviation present.  Cardiovascular: An irregularly irregular rhythm present. Tachycardia present.   Pulses:      Radial pulses are 2+ on the right side, and 2+ on the left side.       Dorsalis pedis pulses are 2+ on the right side, and 2+ on the left side.       Posterior tibial pulses are 2+ on the right side, and 2+ on the left side.  Cap refill < 3 seconds Negative swelling or pitting edema noted to the lower extremities bilaterally   Pulmonary/Chest: Effort normal and breath sounds normal. No respiratory distress. He has no wheezes. He has no rales. He exhibits no tenderness.  Musculoskeletal: Normal range of motion.  Full ROM to upper and lower extremities without difficulty noted, negative ataxia noted.  Lymphadenopathy:    He has no cervical adenopathy.  Neurological: He is alert and oriented to person, place, and time. No  cranial nerve deficit. He exhibits normal muscle tone. Coordination normal.  Cranial nerves III-XII grossly intact Strength 5+/5+ to upper and lower extremities bilaterally with resistance applied, equal distribution noted Equal grip strength  Negative facial drooping Negative slurred speech  Negative aphasia  Skin: Skin is warm and dry. No rash noted. He is not diaphoretic. No erythema.  Psychiatric: He has a normal mood and affect. His behavior is normal. Thought content normal.    ED Course  Procedures (including critical care time)  2:10 PM This provider spoke with Dr. Wynonia Lawman, Cardiologist - discussed case in great detail. Dr. Wynonia Lawman to come and assess patient.   3:21 PM Cardiology NP at bedside assessing patient.   4:17 PM Dr. Wynonia Lawman at bedside assessed patient.   Results for orders placed during the hospital encounter of 06/07/14  CBC      Result Value Ref Range   WBC 10.0  4.0 - 10.5 K/uL  RBC 4.40  4.22 - 5.81 MIL/uL   Hemoglobin 14.4  13.0 - 17.0 g/dL   HCT 42.9  39.0 - 52.0 %   MCV 97.5  78.0 - 100.0 fL   MCH 32.7  26.0 - 34.0 pg   MCHC 33.6  30.0 - 36.0 g/dL   RDW 13.4  11.5 - 15.5 %   Platelets 301  150 - 400 K/uL  COMPREHENSIVE METABOLIC PANEL      Result Value Ref Range   Sodium 143  137 - 147 mEq/L   Potassium 4.5  3.7 - 5.3 mEq/L   Chloride 102  96 - 112 mEq/L   CO2 27  19 - 32 mEq/L   Glucose, Bld 106 (*) 70 - 99 mg/dL   BUN 20  6 - 23 mg/dL   Creatinine, Ser 1.15  0.50 - 1.35 mg/dL   Calcium 10.0  8.4 - 10.5 mg/dL   Total Protein 7.4  6.0 - 8.3 g/dL   Albumin 3.8  3.5 - 5.2 g/dL   AST 24  0 - 37 U/L   ALT 25  0 - 53 U/L   Alkaline Phosphatase 99  39 - 117 U/L   Total Bilirubin 0.5  0.3 - 1.2 mg/dL   GFR calc non Af Amer 64 (*) >90 mL/min   GFR calc Af Amer 74 (*) >90 mL/min   Anion gap 14  5 - 15  PROTIME-INR      Result Value Ref Range   Prothrombin Time 13.4  11.6 - 15.2 seconds   INR 1.02  0.00 - 1.49  APTT      Result Value Ref Range    aPTT 36  24 - 37 seconds  I-STAT TROPOININ, ED      Result Value Ref Range   Troponin i, poc 0.01  0.00 - 0.08 ng/mL   Comment 3             Labs Review Labs Reviewed  COMPREHENSIVE METABOLIC PANEL - Abnormal; Notable for the following:    Glucose, Bld 106 (*)    GFR calc non Af Amer 64 (*)    GFR calc Af Amer 74 (*)    All other components within normal limits  CBC  PROTIME-INR  APTT  URINALYSIS, ROUTINE W REFLEX MICROSCOPIC  I-STAT TROPOININ, ED    Imaging Review Dg Chest Port 1 View  06/07/2014   CLINICAL DATA:  Atrial fibrillation, tachycardia, history hypertension, hyperlipidemia, prostate cancer, asthma  EXAM: PORTABLE CHEST - 1 VIEW  COMPARISON:  Portable exam 1402 hr compared to 05/25/2014  FINDINGS: Upper normal heart size.  Normal mediastinal contours and pulmonary vascularity.  Numerous EKG leads project over RIGHT chest.  No definite infiltrate, pleural effusion or pneumothorax.  Bones unremarkable.  IMPRESSION: No acute abnormalities.   Electronically Signed   By: Lavonia Dana M.D.   On: 06/07/2014 14:37     EKG Interpretation   Date/Time:  Sunday June 07 2014 16:23:11 EDT Ventricular Rate:  74 PR Interval:    QRS Duration: 91 QT Interval:  341 QTC Calculation: 378 R Axis:   89 Text Interpretation:  Atrial flutter with predominant 4:1 AV block  Borderline right axis deviation Borderline ST depression, diffuse leads No  significant change since last tracing Confirmed by YAO  MD, DAVID ([id number])  on 06/07/2014 4:34:50 PM      MDM   Final diagnoses:  Paroxysmal atrial fibrillation    Medications  diltiazem (CARDIZEM) 1 mg/mL load via infusion  10 mg (0 mg Intravenous Stopped 06/07/14 1357)    And  diltiazem (CARDIZEM) 100 mg in dextrose 5 % 100 mL infusion (10 mg/hr Intravenous Rate/Dose Change 06/07/14 1442)   Filed Vitals:   06/07/14 1615 06/07/14 1630 06/07/14 1631 06/07/14 1645  BP: 105/73 112/70 112/70 100/72  Pulse: 77 74  73  Temp:      TempSrc:       Resp: 18 21 20 20   SpO2: 99% 100% 100% 99%   This provider reviewed the patient's chart. Patient was last seen and assessed in the ED setting on 05/25/2014 where patient was admitted to the hospital, Cardiology floor regarding new onset Afib. Patient was cardioverted and brought back to sinus rhythm. Patient was continued on Cardizem for rate control ad Eliquis for anticoagulation purposes.  EKG noted atrial flutter with a variable AV block with premature ventricular or aberrantly conducted with marked ST abnormality with heart rate with 150 bpm. Troponin negative elevation. CBC negative elevated WBC. CMP negative findings - BUN and Creatinine negative elevation, BUN 20, Cr 1.15. INR 1.02, PT 13.4. Chest xray negative for acute cardiopulmonary disease.  Patient started on Cardizem for rate control. Upon arrival to the ED patient's heart rate decreased from 158 to 83 bpm, but inconsistent. Cardiology consulted and for patient to be admitted regarding paroxysmal Afib. Patient to be admitted to Cardiology services. Discussed plan for admission with patient who agreed and understood plan. Patient stable for transfer.    Jamse Mead, PA-C 06/07/14 1714

## 2014-06-07 NOTE — ED Notes (Signed)
Brought pt back to room via wheelchair with family in tow; pt undressed, in gown, on monitor, continuous pulse oximetry and blood pressure cuff; family at bedside; Izora Gala, RN aware of pt

## 2014-06-07 NOTE — H&P (Signed)
History and Physical  Patient ID: Halo Laski MRN: [id number], DOB: Feb 23, 1946 Date of Encounter: 06/07/2014, 3:03 PM Primary Physician: Henrine Screws, MD Primary Cardiologist: Aundra Dubin  Chief Complaint: feeling "puny"  Reason for Admission: back in atrial flutter  HPI: Mr. Chance is a 68 y/o M with history of HTN, HL, prostate CA s/p seed implant 02/2014 who was recently admitted several weeks ago for new onset atrial flutter as well as fibrillation in the setting of several days of Campylobacter diarrhea. He was treated with azithromycin and probiotics by IM. He underwent TEE/DCCV 05/28/14. TEE had shown normal LV function; mild AI, MR, TR and PI; no left atrial or appendage thrombus. At discharge, he went home on Diltiazem 120mg  daily and Eliquis 5mg  BID. Diovan HCT had been stopped during that admission. He went on a trip to [place] after discharge. He began to have some urinary frequency which was annoying to him while on the trip. Since he was feeling good from an AF standpoint, he decided to stop his Diltiazem completely and decrease Eliquis to once per day because he wasn't sure if they were contributing to a diuretic effect. He is on no diuretics at present time. He felt well throughout last week, hiking and biking as usual without any anginal symptoms. On Friday he began to feel washed out and weak, similar to when he was in AF last time. He attributed this to being on his trip. He felt similarly on Saturday but wondered if this was due to the long travel day. Today he checked his pulse rate and discovered it to be rapid and irregular again, prompting him to go to [facility] to be checked out. He was subsequently sent to the ED. He was found to be back in atrial flutter 2:1 conduction. He doesn't really have awareness of palpitations, only just feeling "crappy" when he has rapid rates. He had improvement in HR and symptoms with diltiazem bolus and drip. His previous diarrhea has  resolved.   Past Medical History  Diagnosis Date  . Hypertension   . BPH (benign prostatic hyperplasia)   . Hyperlipidemia   . Prostate cancer DX  12/16/13    gleason 3+3=6, volume 48 gm, s/p seed implant 02/2014.  Marland Kitchen Arthritis     JOINTS  . History of asthma     EXERCISE INDUCED ASTHMA  1996 FOR 6 MONTHS  . Atrial fibrillation     a. 05/25/14 in the setting of campylobacter infection and diarrhea, TEE w/ cardioversion on 7/2. on eliquis and diltiazem  . Campylobacter diarrhea     a. 05/25/14     Most Recent Cardiac Studies: TEE 05/28/14 - Left ventricle: Systolic function was normal. The estimated ejection fraction was in the range of 55% to 65%. Wall motion was normal; there were no regional wall motion abnormalities. - Aortic valve: No evidence of vegetation. There was mild regurgitation. - Mitral valve: No evidence of vegetation. There was mild regurgitation. - Left atrium: The atrium was mildly dilated. No evidence of thrombus in the atrial cavity or appendage. - Right atrium: No evidence of thrombus in the atrial cavity or appendage. - Atrial septum: Doppler showed no right-to-left atrial level shunt. There was an atrial septal aneurysm. - Tricuspid valve: No evidence of vegetation. - Pulmonic valve: No evidence of vegetation. - Pericardium, extracardiac: A trivial pericardial effusion was identified. Impressions: - Normal LV function; mild AI, MR, TR and PI; no left atrial or appendage thrombus.   2D Echo 05/26/14 -  Left ventricle: The cavity size was normal. There was mild focal basal hypertrophy of the septum. Systolic function was normal. The estimated ejection fraction was in the range of 60% to 65%. Wall motion was normal; there were no regional wall motion abnormalities. The study was not technically sufficient to allow evaluation of LV diastolic dysfunction due to atrial fibrillation. - Aortic valve: Trileaflet; normal thickness leaflets. - Mitral valve:  Structurally normal valve. There was no regurgitation. - Right atrium: The atrium was normal in size. - Tricuspid valve: There was no regurgitation. - Inferior vena cava: The vessel was normal in size. - Pericardium, extracardiac: There was no pericardial effusion. Impressions: - Normal biventricular size and systolic function. No significant valvular abnormality.   Surgical History:  Past Surgical History  Procedure Laterality Date  . Knee arthroscopy  09/25/2012    Procedure: ARTHROSCOPY KNEE;  Surgeon: Gearlean Alf, MD;  Location: [facility];  Service: Orthopedics;  Laterality: Left;  with medial and lateral meniscus Debridement  . Prostate biopsy  12/16/13    Gleason 6, volume 48 gm  . Right knee surgery  1975  . Radioactive seed implant N/A 03/18/2014    Procedure: RADIOACTIVE SEED IMPLANT;  Surgeon: [name], MD;  Location: [facility];  Service: Urology;  Laterality: N/A;  . Tee without cardioversion N/A 05/28/2014    Procedure: TRANSESOPHAGEAL ECHOCARDIOGRAM (TEE);  Surgeon: Lelon Perla, MD;  Location: [place];  Service: Cardiovascular;  Laterality: N/A;  . Cardioversion N/A 05/28/2014    Procedure: CARDIOVERSION;  Surgeon: Lelon Perla, MD;  Location: [facility];  Service: Cardiovascular;  Laterality: N/A;     Home Meds: Prior to Admission medications   Medication Sig Start Date End Date Taking? Authorizing Provider  apixaban (ELIQUIS) 5 MG TABS tablet Take 5 mg by mouth 2 (two) times daily.   Yes Historical Provider, MD  diltiazem (CARDIZEM CD) 120 MG 24 hr capsule Take 1 capsule (120 mg total) by mouth daily. 05/28/14  Yes Almyra Deforest, PA  hydroxypropyl methylcellulose (ISOPTO TEARS) 2.5 % ophthalmic solution Place 1 drop into both eyes daily.   Yes Historical Provider, MD  Multiple Vitamin (MULTIVITAMIN WITH MINERALS) TABS Take 1 tablet by mouth every evening.    Yes Historical Provider, MD  rosuvastatin (CRESTOR) 10 MG tablet Take 5 mg by mouth  every evening.   Yes Historical Provider, MD  valsartan-hydrochlorothiazide (DIOVAN-HCT) 160-25 MG per tablet Take 0.5 tablets by mouth daily.    Yes Historical Provider, MD    Allergies: No Known Allergies  History   Social History  . Marital Status: Married    Spouse Name: N/A    Number of Children: N/A  . Years of Education: N/A   Occupational History  . 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 Smoking status: Never Smoker   . Smokeless tobacco: Never Used  . Alcohol Use: 7.0 oz/week    14 drink(s) per week     Comment: 2-3 DRINKS DAILY (standard size)  . Drug Use: No  . Sexual Activity: Not on file   Other Topics Concern  . Not on file   Social History Narrative  . No narrative on file     Family History  Problem Relation Age of Onset  . Dementia Mother   . Parkinson's disease Father   . Heart disease Brother     Older brother had some sort  of rhythm procedure, is now active afterwards    Review of Systems: General: negative for chills, fever, night sweats or weight changes.  Cardiovascular: negative for chest pain, edema, orthopnea, paroxysmal nocturnal dyspnea, shortness of breath or dyspnea on exertion Dermatological: negative for rash Respiratory: negative for cough or wheezing Urologic: negative for hematuria Abdominal: negative for nausea, vomiting, diarrhea, melena, hematemesis. Rare spotty BRBPR - no prolonged or recent episodes (pt instructed to notify MD for recurrence) Neurologic: negative for visual changes, syncope, or dizziness All other systems reviewed and are otherwise negative except as noted above.  Labs:   Lab Results  Component Value Date   WBC 10.0 06/07/2014   HGB 14.4 06/07/2014   HCT 42.9 06/07/2014   MCV 97.5 06/07/2014   PLT 301 06/07/2014    Recent Labs Lab 06/07/14 1314  NA 143  K 4.5  CL 102  CO2 27  BUN 20  CREATININE 1.15  CALCIUM 10.0  PROT 7.4  BILITOT 0.5    ALKPHOS 99  ALT 25  AST 24  GLUCOSE 106*    Lab Results  Component Value Date   CHOL 105 05/26/2014   HDL 39* 05/26/2014   LDLCALC 43 05/26/2014   TRIG 117 05/26/2014   Radiology/Studies:  Dg Chest 2 View  05/25/2014   CLINICAL DATA:  Hypertension and tachycardia.  EXAM: CHEST  2 VIEW  COMPARISON:  02/17/2014.  FINDINGS: The cardiac silhouette, mediastinal and hilar contours are within normal limits and stable. The lungs are clear. No pleural effusion. The bony thorax is intact.  IMPRESSION: No acute cardiopulmonary findings.   Electronically Signed   By: Kalman Jewels M.D.   On: 05/25/2014 14:28   Dg Chest Port 1 View  06/07/2014   CLINICAL DATA:  Atrial fibrillation, tachycardia, history hypertension, hyperlipidemia, prostate cancer, asthma  EXAM: PORTABLE CHEST - 1 VIEW  COMPARISON:  Portable exam 1402 hr compared to 05/25/2014  FINDINGS: Upper normal heart size.  Normal mediastinal contours and pulmonary vascularity.  Numerous EKG leads project over RIGHT chest.  No definite infiltrate, pleural effusion or pneumothorax.  Bones unremarkable.  IMPRESSION: No acute abnormalities.   Electronically Signed   By: Lavonia Dana M.D.   On: 06/07/2014 14:37    EKG: atrial flutter 150bpm, 2:1, inferolateral ST changes  Physical Exam: Blood pressure 111/75, pulse 101, temperature 98.5 F (36.9 C), temperature source Oral, resp. rate 18, SpO2 100.00%. General: Pleasant white male in no acute distress Head: Normocephalic, atraumatic, sclera non-icteric, no xanthomas, nares are without discharge.  Neck: Negative for carotid bruits. JVD not elevated. Lungs: Clear bilaterally to auscultation without wheezes, rales, or rhonchi. Breathing is unlabored. Heart: Irregularly irregular, with S1 S2. No murmurs, rubs, or gallops appreciated. Abdomen: Soft, non-tender, non-distended with normoactive bowel sounds. No hepatomegaly. No rebound/guarding. No obvious abdominal masses. Msk:  Strength and tone appear  normal for age. Extremities: No clubbing or cyanosis. No edema.  Distal pedal pulses are 2+ and equal bilaterally. Neuro: Alert and oriented X 3. No focal deficit. No facial asymmetry. Moves all extremities spontaneously. Psych:  Responds to questions appropriately with a normal affect.    ASSESSMENT AND PLAN:   1. Recurrent atrial flutter with recent admission for newly recognized atrial flutter/fibrillation 2. Medication noncompliance, due to pt perceived urinary frequency 3. HTN, controlled 4. Hyperlipidemia 5. H/o prostate CA  Will admit and continue IV diltiazem. Will ask EP to evaluate in the morning for antiarrhythmic options vs candidacy for ablation. Check UA to exclude UTI  given h/o prostate CA and recent urinary frequency. Resume Eliquis at full dosing once more - importance of full med adherence discussed with the patient who verbalizes understanding of importance. Discussed ST changes with MD - no recent anginal symptoms - able to hike/bike without symptoms when in NS[place], as recently as last week.  Addendum: It appears he was still taking Diovan HCT. As with last admission, will stop this again to allow BP room for med titration.  Patient seen and examined and I agree totally with history above. Complete record reviewed. Full physical examination done. His initial EKG showed atrial flutter with some lateral ST depression likely rate related.  The EKG shows what appears to be atypical atrial flutter perhaps a left atrial flutter and may consider ablation of this. He would be very interested in trying to avoid recurrent events as he has been having.

## 2014-06-08 DIAGNOSIS — I119 Hypertensive heart disease without heart failure: Secondary | ICD-10-CM | POA: Diagnosis present

## 2014-06-08 DIAGNOSIS — I4891 Unspecified atrial fibrillation: Secondary | ICD-10-CM

## 2014-06-08 DIAGNOSIS — G4733 Obstructive sleep apnea (adult) (pediatric): Secondary | ICD-10-CM | POA: Diagnosis present

## 2014-06-08 LAB — CBC
HEMATOCRIT: 38.7 % — AB (ref 39.0–52.0)
Hemoglobin: 12.7 g/dL — ABNORMAL LOW (ref 13.0–17.0)
MCH: 31.8 pg (ref 26.0–34.0)
MCHC: 32.8 g/dL (ref 30.0–36.0)
MCV: 97 fL (ref 78.0–100.0)
Platelets: 251 10*3/uL (ref 150–400)
RBC: 3.99 MIL/uL — ABNORMAL LOW (ref 4.22–5.81)
RDW: 13.4 % (ref 11.5–15.5)
WBC: 5.6 10*3/uL (ref 4.0–10.5)

## 2014-06-08 LAB — BASIC METABOLIC PANEL
Anion gap: 10 (ref 5–15)
BUN: 16 mg/dL (ref 6–23)
CO2: 29 mEq/L (ref 19–32)
CREATININE: 1.11 mg/dL (ref 0.50–1.35)
Calcium: 9.2 mg/dL (ref 8.4–10.5)
Chloride: 104 mEq/L (ref 96–112)
GFR calc Af Amer: 77 mL/min — ABNORMAL LOW (ref >90)
GFR, EST NON AFRICAN AMERICAN: 66 mL/min — AB (ref >90)
GLUCOSE: 112 mg/dL — AB (ref 70–99)
Potassium: 4.3 mEq/L (ref 3.7–5.3)
Sodium: 143 mEq/L (ref 137–147)

## 2014-06-08 LAB — TROPONIN I: Troponin I: <0.3 ng/mL (ref <0.30)

## 2014-06-08 MED ORDER — FLECAINIDE ACETATE 100 MG PO TABS
100.0000 mg | ORAL_TABLET | Freq: Two times a day (BID) | ORAL | Status: DC
  Administered 2014-06-08 – 2014-06-09 (×3): 100 mg via ORAL
  Filled 2014-06-08 (×4): qty 1

## 2014-06-08 MED ORDER — SODIUM CHLORIDE 0.9 % IV SOLN
INTRAVENOUS | Status: DC
Start: 2014-06-08 — End: 2014-06-09
  Administered 2014-06-08: 21:00:00 via INTRAVENOUS

## 2014-06-08 NOTE — ED Provider Notes (Signed)
Medical screening examination/treatment/procedure(s) were conducted as a shared visit with non-physician practitioner(s) and myself.  I personally evaluated the patient during the encounter.   EKG Interpretation   Date/Time:  Sunday June 07 2014 16:23:11 EDT Ventricular Rate:  74 PR Interval:    QRS Duration: 91 QT Interval:  341 QTC Calculation: 378 R Axis:   89 Text Interpretation:  Atrial flutter with predominant 4:1 AV block  Borderline right axis deviation Borderline ST depression, diffuse leads No  significant change since last tracing Confirmed by YAO  MD, DAVID ([id number])  on 06/07/2014 4:34:50 PM      Pt with rapid a-fib, started on cardizem, consult with cardiology  Malvin Johns, MD 06/08/14 2019

## 2014-06-08 NOTE — Care Management Note (Unsigned)
    Page 1 of 1   06/08/2014     11:19:14 AM CARE MANAGEMENT NOTE 06/08/2014  Patient:  William Combs, William Combs   Account Number:  [id number]  Date Initiated:  06/08/2014  Documentation initiated by:  Amram Maya  Subjective/Objective Assessment:   Pt adm on 06/07/14 with Afib/flutter.  PTA, pt independent, lives with spouse.     Action/Plan:   Will follow for dc needs as pt progression.  Planning cardioversion on 7/14.   Anticipated DC Date:  06/10/2014   Anticipated DC Plan:  [place]  CM consult      Choice offered to / List presented to:             Status of service:  In process, will continue to follow Medicare Important Message given?   (If response is "NO", the following Medicare IM given date fields will be blank) Date Medicare IM given:   Medicare IM given by:   Date Additional Medicare IM given:   Additional Medicare IM given by:    Discharge Disposition:    Per UR Regulation:  Reviewed for med. necessity/level of care/duration of stay  If discussed at [place] of Stay Meetings, dates discussed:    Comments:

## 2014-06-08 NOTE — Progress Notes (Addendum)
Patient Name: William Combs      SUBJECTIVE: without complaint*  Past Medical History  Diagnosis Date  . Hypertension   . BPH (benign prostatic hyperplasia)   . Hyperlipidemia   . Prostate cancer DX  12/16/13    gleason 3+3=6, volume 48 gm, s/p seed implant 02/2014.  Marland Kitchen Arthritis     JOINTS  . History of asthma     EXERCISE INDUCED ASTHMA  1996 FOR 6 MONTHS  . Atrial fibrillation     a. 05/25/14 in the setting of campylobacter infection and diarrhea, TEE w/ cardioversion on 7/2. on eliquis and diltiazem  . Campylobacter diarrhea     a. 05/25/14    Scheduled Meds:  Scheduled Meds: . apixaban  5 mg Oral BID  . atorvastatin  10 mg Oral q1800  . multivitamin with minerals  1 tablet Oral QPM  . off the beat book   Does not apply Once  . polyvinyl alcohol  1 drop Both Eyes Daily   Continuous Infusions: . diltiazem (CARDIZEM) infusion 5 mg/hr (06/07/14 2228)   acetaminophen, ondansetron (ZOFRAN) IV    PHYSICAL EXAM Filed Vitals:   06/07/14 1745 06/07/14 1811 06/07/14 2100 06/08/14 0449  BP: 100/60 107/75 100/67 97/64  Pulse:  73 75 70  Temp:  98.2 F (36.8 C) 98.8 F (37.1 C) 98.2 F (36.8 C)  TempSrc:  Oral Oral Oral  Resp: 18 17 18 18   Height:   5\' 11"  (1.803 m)   Weight:   186 lb 11.7 oz (84.7 kg)   SpO2:  99% 97% 96%    Well developed and nourished in no acute distress HENT normal Neck supple with JVP-flat Carotids brisk and full without bruits Clear Irregularly irregular rate and rhythm with controlled ventricular response, no murmurs or gallops Abd-soft with active BS without hepatomegaly No Clubbing cyanosis edema Skin-warm and dry A & Oriented  Grossly normal sensory and motor function   TELEMETRY: Reviewed telemetry pt in AFIB    Intake/Output Summary (Last 24 hours) at 06/08/14 0915 Last data filed at 06/08/14 0700  Gross per 24 hour  Intake      0 ml  Output      0 ml  Net      0 ml    LABS: Basic Metabolic  Panel:  Recent Labs Lab 06/07/14 1314 06/08/14 0003  NA 143 143  K 4.5 4.3  CL 102 104  CO2 27 29  GLUCOSE 106* 112*  BUN 20 16  CREATININE 1.15 1.11  CALCIUM 10.0 9.2   Cardiac Enzymes:  Recent Labs  06/07/14 1920 06/08/14 0003 06/08/14 0725  TROPONINI <0.30 <0.30 <0.30   CBC:  Recent Labs Lab 06/07/14 1314 06/08/14 0003  WBC 10.0 5.6  HGB 14.4 12.7*  HCT 42.9 38.7*  MCV 97.5 97.0  PLT 301 251   PROTIME:  Recent Labs  06/07/14 1314  LABPROT 13.4  INR 1.02   Liver Function Tests:  Recent Labs  06/07/14 1314  AST 24  ALT 25  ALKPHOS 99  BILITOT 0.5  PROT 7.4  ALBUMIN 3.8  *   ASSESSMENT AND PLAN:  Principal Problem:   Atrial fibrillation and flutter Active Problems:   Prostate cancer   HTN (hypertension)   Hyperlipemia   Urinary frequency   Atrial flutter   Sleep-disordered breathing   Hypertensive heart disease  No HX  Of CAD so would  Begin flecainide  DCCVTEE in am Needs myoview GXT conitnue  current meds Reviewed physiology of AFib, antiarrhythmic drugs and risk of proarrhythmia, need for anticoagulation and role of ablation  He also has sleep disordered breathing and needs sleep study  Signed, Virl Axe MD  06/08/2014

## 2014-06-09 ENCOUNTER — Encounter (HOSPITAL_COMMUNITY): Payer: Self-pay | Admitting: *Deleted

## 2014-06-09 ENCOUNTER — Encounter (HOSPITAL_COMMUNITY): Payer: Medicare Other | Admitting: Anesthesiology

## 2014-06-09 ENCOUNTER — Encounter (HOSPITAL_COMMUNITY)
Admission: EM | Disposition: A | Discharge status: Home / Self Care | Payer: Self-pay | Source: Home / Self Care | Attending: Cardiology

## 2014-06-09 ENCOUNTER — Inpatient Hospital Stay (HOSPITAL_COMMUNITY): Payer: Medicare Other | Admitting: Anesthesiology

## 2014-06-09 DIAGNOSIS — I4892 Unspecified atrial flutter: Principal | ICD-10-CM

## 2014-06-09 HISTORY — PX: CARDIOVERSION: SHX1299

## 2014-06-09 HISTORY — PX: TEE WITHOUT CARDIOVERSION: SHX5443

## 2014-06-09 SURGERY — ECHOCARDIOGRAM, TRANSESOPHAGEAL
Anesthesia: Monitor Anesthesia Care

## 2014-06-09 MED ORDER — LIDOCAINE VISCOUS 2 % MT SOLN
OROMUCOSAL | Status: DC | PRN
  Administered 2014-06-09: 10 mL via OROMUCOSAL

## 2014-06-09 MED ORDER — LIDOCAINE HCL (CARDIAC) 20 MG/ML IV SOLN
INTRAVENOUS | Status: AC
  Filled 2014-06-09: qty 5

## 2014-06-09 MED ORDER — PROPOFOL 10 MG/ML IV BOLUS
INTRAVENOUS | Status: AC
  Filled 2014-06-09: qty 20

## 2014-06-09 MED ORDER — PROPOFOL INFUSION 10 MG/ML OPTIME
INTRAVENOUS | Status: DC | PRN
Start: 2014-06-09 — End: 2014-06-09
  Administered 2014-06-09: 5 mL via INTRAVENOUS
  Administered 2014-06-09: 4 mL via INTRAVENOUS

## 2014-06-09 MED ORDER — LIDOCAINE VISCOUS 2 % MT SOLN
OROMUCOSAL | Status: AC
  Filled 2014-06-09: qty 15

## 2014-06-09 MED ORDER — DILTIAZEM HCL ER COATED BEADS 120 MG PO CP24
120.0000 mg | ORAL_CAPSULE | Freq: Every day | ORAL | Status: DC
  Administered 2014-06-09: 120 mg via ORAL
  Filled 2014-06-09: qty 1

## 2014-06-09 MED ORDER — ACETAMINOPHEN 325 MG PO TABS: 650.0000 mg | ORAL_TABLET | ORAL | Status: DC | PRN

## 2014-06-09 MED ORDER — FLECAINIDE ACETATE 100 MG PO TABS: 100.0000 mg | ORAL_TABLET | Freq: Two times a day (BID) | ORAL | Status: DC

## 2014-06-09 MED ORDER — PROPOFOL INFUSION 10 MG/ML OPTIME
INTRAVENOUS | Status: DC | PRN
  Administered 2014-06-09: 140 ug/kg/min via INTRAVENOUS

## 2014-06-09 MED ORDER — BUTAMBEN-TETRACAINE-BENZOCAINE 2-2-14 % EX AERO
INHALATION_SPRAY | CUTANEOUS | Status: DC | PRN
Start: 2014-06-09 — End: 2014-06-09
  Administered 2014-06-09: 2 via TOPICAL

## 2014-06-09 NOTE — Anesthesia Procedure Notes (Signed)
Procedure Name: MAC Date/Time: 06/09/2014 10:10 AM Performed by: Kyung Rudd Pre-anesthesia Checklist: Patient identified, Emergency Drugs available, Suction available, Patient being monitored and Timeout performed Patient Re-evaluated:Patient Re-evaluated prior to inductionOxygen Delivery Method: Nasal cannula Preoxygenation: Pre-oxygenation with 100% oxygen Intubation Type: IV induction Placement Confirmation: positive ETCO2

## 2014-06-09 NOTE — Progress Notes (Signed)
06/09/2014 1330 Nursing note Pt. States MD plans to d/c pt. Home today. PT. Currently still on IV Cardizem post TEE/DCCV. Pt. Maintaining NSR. Dr. Lovena Le paged and verbal orders received to give pt. Home Cardizem dose of 120 mg PO x 1 and d/c gtt. Orders enacted. Pt. Updated on plan of care. Will continue to closely monitor patient.  Alijah Akram, Arville Lime

## 2014-06-09 NOTE — Discharge Instructions (Signed)
Our office will order your sleep study when you see Richardson Dopp on the 8th of August.   Plan stress test on the 16th.  Heart healthy diet.  Do not stop meds or change the way they have been ordered without our calling our office to discuss.  Your appointment with "coumadin" clinic is to discuss eliquis not coumadin

## 2014-06-09 NOTE — Progress Notes (Signed)
ELECTROPHYSIOLOGY ROUNDING NOTE    Patient Name: Kaylin Schellenberg Date of Encounter: 06/09/2014    SUBJECTIVE:Patient feels well this morning.  No chest pain or shortness of breath.   For TEE/DCCV this morning.    TELEMETRY: Reviewed telemetry pt in atrial fib/flutter with variable ventricular conduction, rates 80-150's Filed Vitals:   06/08/14 0449 06/08/14 1335 06/08/14 2052 06/09/14 0509  BP: 97/64 113/71 111/72 113/69  Pulse: 70 76 68 64  Temp: 98.2 F (36.8 C) 98.2 F (36.8 C) 98.2 F (36.8 C) 97.7 F (36.5 C)  TempSrc: Oral Oral Oral Oral  Resp: 18 20 18 20   Height:      Weight:      SpO2: 96% 99% 98% 98%    Intake/Output Summary (Last 24 hours) at 06/09/14 0649 Last data filed at 06/08/14 1700  Gross per 24 hour  Intake    840 ml  Output      0 ml  Net    840 ml    CURRENT MEDICATIONS: . apixaban  5 mg Oral BID  . atorvastatin  10 mg Oral q1800  . flecainide  100 mg Oral Q12H  . multivitamin with minerals  1 tablet Oral QPM  . off the beat book   Does not apply Once  . polyvinyl alcohol  1 drop Both Eyes Daily    LABS: Basic Metabolic Panel:  Recent Labs  06/07/14 1314 06/08/14 0003  NA 143 143  K 4.5 4.3  CL 102 104  CO2 27 29  GLUCOSE 106* 112*  BUN 20 16  CREATININE 1.15 1.11  CALCIUM 10.0 9.2   Liver Function Tests:  Recent Labs  06/07/14 1314  AST 24  ALT 25  ALKPHOS 99  BILITOT 0.5  PROT 7.4  ALBUMIN 3.8   CBC:  Recent Labs  06/07/14 1314 06/08/14 0003  WBC 10.0 5.6  HGB 14.4 12.7*  HCT 42.9 38.7*  MCV 97.5 97.0  PLT 301 251   Cardiac Enzymes:  Recent Labs  06/07/14 1920 06/08/14 0003 06/08/14 0725  TROPONINI <0.30 <0.30 <0.30     Radiology/Studies:  Dg Chest Port 1 View 06/07/2014   CLINICAL DATA:  Atrial fibrillation, tachycardia, history hypertension, hyperlipidemia, prostate cancer, asthma  EXAM: PORTABLE CHEST - 1 VIEW  COMPARISON:  Portable exam 1402 hr compared to 05/25/2014  FINDINGS: Upper normal  heart size.  Normal mediastinal contours and pulmonary vascularity.  Numerous EKG leads project over RIGHT chest.  No definite infiltrate, pleural effusion or pneumothorax.  Bones unremarkable.  IMPRESSION: No acute abnormalities.   Electronically Signed   By: Lavonia Dana M.D.   On: 06/07/2014 14:37     PHYSICAL EXAM Well appearing 68 yo man, NAD HEENT: Unremarkable,[place], AT Neck:  6 JVD, no thyromegally Back:  No CVA tenderness Lungs:  Clear with no wheezes, rales, or rhonchi HEART:  Regular rate rhythm, no murmurs, no rubs, no clicks Abd:  soft, positive bowel sounds, no organomegally, no rebound, no guarding Ext:  2 plus pulses, no edema, no cyanosis, no clubbing Skin:  No rashes no nodules Neuro:  CN II through XII intact, motor grossly intact     Principal Problem:   Atrial fibrillation and flutter Active Problems:   Prostate cancer   HTN (hypertension)   Hyperlipemia   Urinary frequency   Atrial flutter   Sleep-disordered breathing   Hypertensive heart disease   Plan - TEE guided DCCV later today. He may go home after. Stress test as below. He will  need outpatient sleep study.GXT 06-11-14 at 7:30AM following Flecainide initiation. Pt aware to avoid strenuous activity until after GXT.   Mikle Bosworth.D.

## 2014-06-09 NOTE — CV Procedure (Signed)
TEE/CARDIOVERSION NOTE  TRANSESOPHAGEAL ECHOCARDIOGRAM (TEE):  Indictation: Atrial Flutter  Consent:   Informed consent was obtained prior to the procedure. The risks, benefits and alternatives for the procedure were discussed and the patient comprehended these risks.  Risks include, but are not limited to, cough, sore throat, vomiting, nausea, somnolence, esophageal and stomach trauma or perforation, bleeding, low blood pressure, aspiration, pneumonia, infection, trauma to the teeth and death.    Time Out: Verified patient identification, verified procedure, site/side was marked, verified correct patient position, special equipment/implants available, medications/allergies/relevent history reviewed, required imaging and test results available. Performed  Procedure:  After a procedural time-out, the patient was given propofol per anesthesia for sedation.  The oropharynx was anesthetized 10 cc of topical 1% viscous lidocaine and 1 cetacaine spray.  The transesophageal probe was inserted in the esophagus and stomach without difficulty and multiple views were obtained.  The patient was kept under observation until the patient left the procedure room.  The patient left the procedure room in stable condition.   Agitated microbubble saline contrast was administered.  Complications:    Complications: None Patient did tolerate procedure well.  Findings:  1. LEFT VENTRICLE: The left ventricular wall thickness is mildly increased.  The left ventricular cavity is normal in size. Wall motion is normal.  LVEF is 55-60%.  2. RIGHT VENTRICLE:  The right ventricle is normal in structure and function without any thrombus or masses.    3. LEFT ATRIUM:  The left atrium is dilated in size without any thrombus or masses.  There is not spontaneous echo contrast ("smoke") in the left atrium consistent with a low flow state.  4. LEFT ATRIAL APPENDAGE:  The left atrial appendage is free of any thrombus  or masses. The appendage has single lobes in a "chickenwing" shape. Pulse doppler indicates moderate flow in the appendage.  5. ATRIAL SEPTUM:  The atrial septum is aneurysmal and is free of thrombus and/or masses. There is no evidence for interatrial shunting by color doppler and saline microbubble.  6. RIGHT ATRIUM:  The right atrium is normal in size and function without any thrombus or masses.  7. MITRAL VALVE:  The mitral valve is normal in structure and function with trace to mild regurgitation with 2 eccentric jets. There were no vegetations or stenosis.  8. AORTIC VALVE:  The aortic valve is trileaflet, normal in structure and function with no regurgitation.  There were no vegetations or stenosis  9. TRICUSPID VALVE:  The tricuspid valve is normal in structure and function with Mild regurgitation.  There were no vegetations or stenosis  10.  PULMONIC VALVE:  The pulmonic valve is normal in structure and function with trace regurgitation.  There were no vegetations or stenosis.   11. AORTIC ARCH, ASCENDING AND DESCENDING AORTA:  There was no atherosclerosis of the ascending aorta, aortic arch, or proximal descending aorta.  12. PULMONARY VEINS: Anomalous pulmonary venous return was not noted.  13. PERICARDIUM: The pericardium appeared normal and non-thickened.  There is no pericardial effusion.  CARDIOVERSION:     Second Time Out: Verified patient identification, verified procedure, site/side was marked, verified correct patient position, special equipment/implants available, medications/allergies/relevent history reviewed, required imaging and test results available.  Performed  Procedure:  1. Patient placed on cardiac monitor, pulse oximetry, supplemental oxygen as necessary.  2. Sedation administered per anesthesia 3. Pacer pads placed anterior and posterior chest. 4. Cardioverted 1 time(s).  5. Cardioverted at 120J biphasic.  Complications:  Complications: None  Patient  did tolerate procedure well.  Impression:  1. No LAA thrombus 2. LVEF 55-60% 3.   Trace to mild MR, TR, and trace PI 4.   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 4.   Successful DCCV to NSR with a single 120J biphasic shock.  Recommendations:  1. Continue flecainide and Eliquis. Outpatient stress test as previously described. 2. Home later today after he is more awake.  Time Spent Directly with the Patient:  30 minutes   Pixie Casino, MD, [facility] Attending Cardiologist [facility]  06/09/2014, 10:32 AM

## 2014-06-09 NOTE — Transfer of Care (Signed)
Immediate Anesthesia Transfer of Care Note  Patient: William Combs  Procedure(s) Performed: Procedure(s): TRANSESOPHAGEAL ECHOCARDIOGRAM (TEE) (N/A) CARDIOVERSION (N/A)  Patient Location: PACU  Anesthesia Type:MAC  Level of Consciousness: awake, alert  and oriented  Airway & Oxygen Therapy: Patient Spontanous Breathing and Patient connected to nasal cannula oxygen  Post-op Assessment: Report given to PACU RN, Post -op Vital signs reviewed and stable and Patient moving all extremities X 4  Post vital signs: Reviewed and stable  Complications: No apparent anesthesia complications

## 2014-06-09 NOTE — Progress Notes (Signed)
  Echocardiogram Echocardiogram Transesophageal has been performed.  Philipp Deputy 06/09/2014, 10:43 AM

## 2014-06-09 NOTE — Discharge Summary (Signed)
Physician Discharge Summary       Patient ID: William Combs MRN: [id number] DOB/AGE: Jul 01, 1946 68 y.o.  Admit date: 06/07/2014 Discharge date: 06/09/2014  Discharge Diagnoses:  Principal Problem:   Atrial fibrillation and flutter, S/P DCCV 06/09/14, to SR Active Problems:   Prostate cancer   Hyperlipemia   Urinary frequency   Atrial flutter   Sleep-disordered breathing   Hypertensive heart disease   Discharged Condition: good  Primary Cardiologist: Dr. Aundra Dubin   Procedures: 06/09/14 TEE and DCCV to SR.  Hospital Course: 68 y/o M with history of HTN, HL, prostate CA s/p seed implant 02/2014 who was recently admitted several weeks ago for new onset atrial flutter as well as fibrillation in the setting of several days of Campylobacter diarrhea. He was treated with azithromycin and probiotics by IM. He underwent TEE/DCCV 05/28/14. TEE had shown normal LV function; mild AI, MR, TR and PI; no left atrial or appendage thrombus. At discharge, he went home on Diltiazem 120mg  daily and Eliquis 5mg  BID. Diovan HCT had been stopped during that admission. He went on a trip to [place] after discharge. He began to have some urinary frequency which was annoying to him while on the trip. Since he was feeling good from an AF standpoint, he decided to stop his Diltiazem completely and decrease Eliquis to once per day because he wasn't sure if they were contributing to a diuretic effect. He is on no diuretics at present time. He felt well throughout last week, hiking and biking as usual without any anginal symptoms. On Friday he began to feel washed out and weak, similar to when he was in AF last time. He attributed this to being on his trip. He felt similarly on Saturday but wondered if this was due to the long travel day. Day of admit he checked his pulse rate and discovered it to be rapid and irregular again, prompting him to go to [facility] to be checked out. He was subsequently sent to the ED.  He was found to be back in atrial flutter 2:1 conduction. He did not really have awareness of palpitations, only just feeling "crappy" when he has rapid rates. He had improvement in HR and symptoms with diltiazem bolus and drip. His previous diarrhea has resolved.   He was admitted and seen by Dr. Caryl Comes and then Dr. Lovena Le.  Pt has been started on flecainide.  We did stop his valsartan HCTZ again.  Drs. Caryl Comes and Lovena Le discussed a fib including ablation.  Also awareness of sleep study.  He then underwent TEE/DCCV with Dr. Debara Pickett 06/09/14 and was successfully converted to SR.   Drs. Caryl Comes and Lovena Le want him to have already scheduled stress myoview.  He also needs sleep study for sleep disordered breathing.   He has been seen by Dr. Lovena Le and after DCCV maintains SR and is stable for discharge.  SLEEP STUDY TO BE SCHEDULED WHEN PT IN THE OFFICE.  Consults: None  Significant Diagnostic Studies:  BMET    Component Value Date/Time   NA 143 06/08/2014 0003   K 4.3 06/08/2014 0003   CL 104 06/08/2014 0003   CO2 29 06/08/2014 0003   GLUCOSE 112* 06/08/2014 0003   BUN 16 06/08/2014 0003   CREATININE 1.11 06/08/2014 0003   CALCIUM 9.2 06/08/2014 0003   GFRNONAA 66* 06/08/2014 0003   GFRAA 77* 06/08/2014 0003    CBC    Component Value Date/Time   WBC 5.6 06/08/2014 0003   RBC 3.99* 06/08/2014 0003  HGB 12.7* 06/08/2014 0003   HCT 38.7* 06/08/2014 0003   PLT 251 06/08/2014 0003   MCV 97.0 06/08/2014 0003   MCH 31.8 06/08/2014 0003   MCHC 32.8 06/08/2014 0003   RDW 13.4 06/08/2014 0003   LYMPHSABS 2.0 05/27/2014 0515   MONOABS 0.9 05/27/2014 0515   EOSABS 0.1 05/27/2014 0515   BASOSABS 0.1 05/27/2014 0515   Troponin neg X 3.    U/a negative  CXR: PORTABLE CHEST - 1 VIEW  COMPARISON: Portable exam 1402 hr compared to 05/25/2014  FINDINGS:  Upper normal heart size.  Normal mediastinal contours and pulmonary vascularity.  Numerous EKG leads project over RIGHT chest.  No definite infiltrate, pleural  effusion or pneumothorax.  Bones unremarkable.  IMPRESSION:  No acute abnormalities   Discharge Exam: Blood pressure 132/81, pulse 65, temperature 97.8 F (36.6 C), temperature source Oral, resp. rate 20, height 5\' 11"  (1.803 m), weight 186 lb 11.7 oz (84.7 kg), SpO2 99.00%.   Disposition: 01-Home or Self Care     Medication List    STOP taking these medications       valsartan-hydrochlorothiazide 160-25 MG per tablet  Commonly known as:  DIOVAN-HCT      TAKE these medications       acetaminophen 325 MG tablet  Commonly known as:  TYLENOL  Take 2 tablets (650 mg total) by mouth every 4 (four) hours as needed for headache or mild pain.     apixaban 5 MG Tabs tablet  Commonly known as:  ELIQUIS  Take 5 mg by mouth 2 (two) times daily.     diltiazem 120 MG 24 hr capsule  Commonly known as:  CARDIZEM CD  Take 1 capsule (120 mg total) by mouth daily.     flecainide 100 MG tablet  Commonly known as:  TAMBOCOR  Take 1 tablet (100 mg total) by mouth every 12 (twelve) hours.     hydroxypropyl methylcellulose 2.5 % ophthalmic solution  Commonly known as:  ISOPTO TEARS  Place 1 drop into both eyes daily.     multivitamin with minerals Tabs tablet  Take 1 tablet by mouth every evening.     rosuvastatin 10 MG tablet  Commonly known as:  CRESTOR  Take 5 mg by mouth every evening.       Follow-up Information   Please follow up. (as previously instructed.)        Discharge Instructions: Our office will order your sleep study when you see Richardson Dopp on the 8th of August.   Plan stress test on the 16th.  Heart healthy diet.  Do not stop meds or change the way they have been ordered without our calling our office to discuss.    Signed: Isaiah Serge Nurse Practitioner-Certified [facility]: [facility] 06/09/2014, 3:33 PM  Time spent on discharge : > 30 minutes.

## 2014-06-09 NOTE — Anesthesia Preprocedure Evaluation (Signed)
Anesthesia Evaluation  Patient identified by MRN, date of birth, ID band Patient awake    Reviewed: Allergy & Precautions, H&P , NPO status , Patient's Chart, lab work & pertinent test results  Airway       Dental   Pulmonary asthma ,          Cardiovascular hypertension, + dysrhythmias Atrial Fibrillation     Neuro/Psych    GI/Hepatic   Endo/Other    Renal/GU      Musculoskeletal   Abdominal   Peds  Hematology   Anesthesia Other Findings Prostate CA w/ Rad seeds  Reproductive/Obstetrics                           Anesthesia Physical Anesthesia Plan  ASA: III  Anesthesia Plan: MAC and General   Post-op Pain Management:    Induction: Intravenous  Airway Management Planned: Mask  Additional Equipment:   Intra-op Plan:   Post-operative Plan:   Informed Consent: I have reviewed the patients History and Physical, chart, labs and discussed the procedure including the risks, benefits and alternatives for the proposed anesthesia with the patient or authorized representative who has indicated his/her understanding and acceptance.     Plan Discussed with:   Anesthesia Plan Comments:         Anesthesia Quick Evaluation

## 2014-06-09 NOTE — Anesthesia Postprocedure Evaluation (Signed)
  Anesthesia Post-op Note  Patient: William Combs  Procedure(s) Performed: Procedure(s): TRANSESOPHAGEAL ECHOCARDIOGRAM (TEE) (N/A) CARDIOVERSION (N/A)  Patient Location: PACU and Endoscopy Unit  Anesthesia Type:MAC  Level of Consciousness: awake, alert  and oriented  Airway and Oxygen Therapy: Patient Spontanous Breathing and Patient connected to nasal cannula oxygen  Post-op Pain: none  Post-op Assessment: Post-op Vital signs reviewed, Patient's Cardiovascular Status Stable, Respiratory Function Stable, Patent Airway and No signs of Nausea or vomiting  Post-op Vital Signs: Reviewed and stable  Last Vitals:  Filed Vitals:   06/09/14 1040  BP:   Pulse: 64  Temp:   Resp: 21    Complications: No apparent anesthesia complications

## 2014-06-09 NOTE — H&P (Signed)
    INTERVAL PROCEDURE H&P  History and Physical Interval Note:  06/09/2014 9:27 AM  William Combs has presented today for their planned procedure. The various methods of treatment have been discussed with the patient and family. After consideration of risks, benefits and other options for treatment, the patient has consented to the procedure.  The patients' outpatient history has been reviewed, patient examined, and no change in status from most recent office note within the past 30 days. I have reviewed the patients' chart and labs and will proceed as planned. Questions were answered to the patient's satisfaction.   Pixie Casino, MD, [facility] Attending Cardiologist [facility]  HILTY,Kenneth C 06/09/2014, 9:27 AM

## 2014-06-09 NOTE — Progress Notes (Signed)
06/09/2014 4:36 PM Nursing note Discharge avs form, medications already taken today and those due this evening given and explained to patient and spouse. Follow up appointments and when to call MD reviewed. D/c iv by NT. D/c tele. D/c home per orders.  Lyne Khurana, Arville Lime

## 2014-06-10 ENCOUNTER — Encounter (HOSPITAL_COMMUNITY): Payer: Self-pay | Admitting: Internal Medicine

## 2014-06-11 ENCOUNTER — Ambulatory Visit (HOSPITAL_COMMUNITY): Payer: Medicare Other | Attending: Internal Medicine | Admitting: Radiology

## 2014-06-11 VITALS — BP 149/84 | Ht 71.0 in | Wt 191.0 lb

## 2014-06-11 DIAGNOSIS — I4891 Unspecified atrial fibrillation: Secondary | ICD-10-CM | POA: Insufficient documentation

## 2014-06-11 DIAGNOSIS — I480 Paroxysmal atrial fibrillation: Secondary | ICD-10-CM

## 2014-06-11 DIAGNOSIS — I4892 Unspecified atrial flutter: Secondary | ICD-10-CM

## 2014-06-11 DIAGNOSIS — I498 Other specified cardiac arrhythmias: Secondary | ICD-10-CM

## 2014-06-11 MED ORDER — TECHNETIUM TC 99M SESTAMIBI GENERIC - CARDIOLITE
30.0000 | Freq: Once | INTRAVENOUS | Status: AC | PRN
  Administered 2014-06-11: 30 via INTRAVENOUS

## 2014-06-11 MED ORDER — TECHNETIUM TC 99M SESTAMIBI GENERIC - CARDIOLITE
10.0000 | Freq: Once | INTRAVENOUS | Status: AC | PRN
  Administered 2014-06-11: 10 via INTRAVENOUS

## 2014-06-11 NOTE — Progress Notes (Addendum)
[facility] [address] [place], [place] [address] [phone]    Cardiology Nuclear Med Study  William Combs is a 68 y.o. male     MRN : [id number]     DOB: Aug 27, 1946  Procedure Date: 06/11/2014  Nuclear Med Background Indication for Stress Test:  Evaluation for Ischemia, and Patient seen in hospital on 06-07-2014 for Atrial Flutter/Atrial Fibrillation with lightheadedness> DCCV to NSR, Enzymes negative, and Flecainide started on 06-08-2014:Assess for Ventricular Arrhythmias History:No Known History of CAD;Afib/Aflutter (newly diagnosed)s/p dcc 7/14/15to NSR ;Echo 05/16/24 EF:60-65%;TEE 05/28/14 EF:55-65%;Asthma Cardiac Risk Factors: Family History - CAD, Hypertension and Lipids  Symptoms:  Dizziness and Fatigue   Nuclear Pre-Procedure Caffeine/Decaff Intake:  None> 12 hrs NPO After: 9:00pm   Lungs:  clear O2 Sat: 96% on room air. IV 0.9% NS with Angio Cath:  20g  IV Site: R Antecubital x 1, tolerated well IV Started by:  Irven Baltimore, RN  Chest Size (in):  42 Cup Size: n/a  Height: 5\' 11"  (1.803 m)  Weight:  191 lb (86.637 kg)  BMI:  Body mass index is 26.65 kg/(m^2). Tech Comments:  Patient took Cardizem and Flecainide this am. Irven Baltimore, RN.    Nuclear Med Study 1 or 2 day study: 1 day  Stress Test Type:  Stress  Reading MD: N/A  Order Authorizing Provider:  Theador Hawthorne  Resting Radionuclide: Technetium 33m Sestamibi  Resting Radionuclide Dose: 10.8 mCi   Stress Radionuclide:  Technetium 19m Sestamibi  Stress Radionuclide Dose: 33.0 mCi           Stress Protocol Rest HR: 57 Stress HR: 127  Rest BP: 149/84 Stress BP: 158/111  Exercise Time (min): 11:43 METS: 13.40   Predicted Max HR: 152 bpm % Max HR: 83.55 bpm Rate Pressure Product: 22733   Dose of Adenosine (mg):  n/a Dose of Lexiscan: n/a mg  Dose of Atropine (mg): n/a Dose of Dobutamine: n/a mcg/kg/min (at max HR)  Stress Test Technologist: Ileene Hutchinson, EMT-P   Nuclear Technologist:  Vedia Pereyra, CNMT     Rest Procedure:  Myocardial perfusion imaging was performed at rest 45 minutes following the intravenous administration of Technetium 6m Sestamibi. Rest ECG: Sinus bradycardia  59 bpm   Stress Procedure:  The patient exercised on the treadmill utilizing the Bruce Protocol for 11:43 minutes. The patient stopped due to SOB/leg fatigue and denied any chest pain.  Technetium 43m Sestamibi was injected at peak exercise and myocardial perfusion imaging was performed after a brief delay. Stress ECG: No significant change from baseline ECG  QPS Raw Data Images: Soft tissue (diaphragm) underlies heart.   Stress Images:  Normal homogeneous uptake in all areas of the myocardium. Rest Images:  Normal homogeneous uptake in all areas of the myocardium. Subtraction (SDS):  No evidence of ischemia. Transient Ischemic Dilatation (Normal <1.22):  1.10 Lung/Heart Ratio (Normal <0.45):  0.30  Quantitative Gated Spect Images QGS EDV:  146 ml QGS ESV:  65 ml  Impression Exercise Capacity:  Excellent exercise capacity. BP Response:  Normal blood pressure response. Clinical Symptoms:  No significant symptoms noted. ECG Impression:  No significant changes to suggest ischemia.  Comparison with Prior Nuclear Study:no prior study.   Overall Impression:  Normal stress nuclear study.  LV Ejection Fraction: 55%.  LV Wall Motion:  NL LV Function; NL Wall Motion  William Combs  Normal study, please report to patient.   William Combs 06/15/2014

## 2014-06-15 NOTE — Progress Notes (Signed)
LMTCB

## 2014-06-15 NOTE — Progress Notes (Signed)
Pt.notified

## 2014-06-23 ENCOUNTER — Ambulatory Visit (INDEPENDENT_AMBULATORY_CARE_PROVIDER_SITE_OTHER): Payer: Medicare Other

## 2014-06-23 VITALS — Wt 191.0 lb

## 2014-06-23 DIAGNOSIS — I4891 Unspecified atrial fibrillation: Secondary | ICD-10-CM

## 2014-06-23 DIAGNOSIS — Z5181 Encounter for therapeutic drug level monitoring: Secondary | ICD-10-CM

## 2014-06-23 LAB — CBC
HCT: 40.8 % (ref 39.0–52.0)
Hemoglobin: 13.6 g/dL (ref 13.0–17.0)
MCHC: 33.4 g/dL (ref 30.0–36.0)
MCV: 97.6 fl (ref 78.0–100.0)
Platelets: 176 10*3/uL (ref 150.0–400.0)
RBC: 4.17 Mil/uL — ABNORMAL LOW (ref 4.22–5.81)
RDW: 14 % (ref 11.5–15.5)
WBC: 6.1 10*3/uL (ref 4.0–10.5)

## 2014-06-23 LAB — BASIC METABOLIC PANEL
BUN: 17 mg/dL (ref 6–23)
CO2: 28 mEq/L (ref 19–32)
Calcium: 9.3 mg/dL (ref 8.4–10.5)
Chloride: 107 mEq/L (ref 96–112)
Creatinine, Ser: 1 mg/dL (ref 0.4–1.5)
GFR: 82.64 mL/min (ref >60.00)
Glucose, Bld: 90 mg/dL (ref 70–99)
Potassium: 4.3 mEq/L (ref 3.5–5.1)
Sodium: 139 mEq/L (ref 135–145)

## 2014-06-23 NOTE — Patient Instructions (Signed)

## 2014-06-23 NOTE — Progress Notes (Signed)
Pt was started on Eliquis 5mg  BID for afib by Dr Aundra Dubin on 05/28/2014.    Reviewed patients medication list.  Pt is not currently on any combined P-gp and strong CYP3A4 inhibitors/inducers (ketoconazole, traconazole, ritonavir, carbamazepine, phenytoin, rifampin, St. John's wort).  Reviewed labs.  SCr 1.0, Weight 86.64 kg, Age-79.  Dose appropriate based on specified criteria.   Hgb and HCT 13.6/40.8.   A full discussion of the nature of anticoagulants has been carried out.  A benefit/risk analysis has been presented to the patient, so that they understand the justification for choosing anticoagulation with Eliquis at this time.  The need for compliance is stressed.  Pt is aware to take the medication twice daily.  Side effects of potential bleeding are discussed, including unusual colored urine or stools, coughing up blood or coffee ground emesis, nose bleeds or serious fall or head trauma.  Discussed signs and symptoms of stroke. The patient should avoid any OTC items containing aspirin or ibuprofen.  Avoid alcohol consumption.   Call if any signs of abnormal bleeding.  Discussed financial obligations and resolved any difficulty in obtaining medication.  Follow up with your MD as scheduled.

## 2014-07-01 ENCOUNTER — Telehealth: Payer: Self-pay | Admitting: Internal Medicine

## 2014-07-01 NOTE — Telephone Encounter (Signed)
New message     For William Combs Pt is seeing Scott tomorrow at 11:30.  He is confused about the inhome sleep study.  Patient does not want to schedule the sleep study until he sees [employer].  You may want to talk to him while he is here tomorrow

## 2014-07-02 ENCOUNTER — Ambulatory Visit (INDEPENDENT_AMBULATORY_CARE_PROVIDER_SITE_OTHER): Payer: Medicare Other | Admitting: Physician Assistant

## 2014-07-02 ENCOUNTER — Encounter: Payer: Self-pay | Admitting: Physician Assistant

## 2014-07-02 VITALS — BP 140/80 | HR 54 | Ht 71.0 in | Wt 191.0 lb

## 2014-07-02 DIAGNOSIS — I10 Essential (primary) hypertension: Secondary | ICD-10-CM

## 2014-07-02 DIAGNOSIS — I4892 Unspecified atrial flutter: Secondary | ICD-10-CM

## 2014-07-02 DIAGNOSIS — I4891 Unspecified atrial fibrillation: Secondary | ICD-10-CM

## 2014-07-02 DIAGNOSIS — E785 Hyperlipidemia, unspecified: Secondary | ICD-10-CM

## 2014-07-02 NOTE — Patient Instructions (Signed)
NO CHANGES WERE MADE TODAY WITH MEDICATIONS  Your physician recommends that you schedule a follow-up appointment in: [place] DR. Aundra Dubin

## 2014-07-02 NOTE — Progress Notes (Signed)
Cardiology Office Note    Date:  07/02/2014   ID:  William Combs, DOB 1946/02/14, MRN [id number]  PCP:  Henrine Screws, MD  Cardiologist:  Dr. [name]   Electrophysiologist:  Clarnce Flock both Dr. Cristopher Peru and Dr. Virl Axe in the hospital   History of Present Illness: William Combs is a 68 y.o. male with a hx of HTN, HL, prostate CA s/p XRT seed implant.  He was admitted 6/29-7/2 with atrial fibrillation/flutter with RVR in the setting of gastroenteritis with associated AKI.  CHADS2-VASc=2 (HTN, age).  He was placed on Eliquis. GI evaluation demonstrated Campylobacter.  He was placed on Zithromax and Probiotics and diarrhea was improved at d/c.  He underwent TEE guided cardioversion with restoration of NSR.  Readmitted 7/12-7/14 with recurrent atrial flutter with RVR. He had gone on a trip to [place]. He had some side effects and decided to stop his diltiazem and decrease his Eliquis to once a day.  He was seen by Dr. Caryl Comes who placed him on Flecainide.  He subsequently underwent repeat TEE guided cardioversion with restoration of normal sinus rhythm. He was set up for outpatient stress test.  Patient exercised for 11:43. Treadmill test was negative for pro-arrhythmia. Nuclear images were negative for ischemia. He returns for follow up.  The patient denies any chest pain, significant dyspnea, syncope, orthopnea, PND, edema.  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 Hypertension   . BPH (benign prostatic hyperplasia)   . Hyperlipidemia   . Prostate cancer DX  12/16/13    gleason 3+3=6, volume 48 gm, s/p seed implant 02/2014.  Marland Kitchen Arthritis     JOINTS  . History of asthma     EXERCISE INDUCED ASTHMA  1996 FOR 6 MONTHS  . Atrial fibrillation     a. 05/25/14 in the setting of campylobacter infection and diarrhea, TEE w/ cardioversion on 7/2. on eliquis and diltiazem  . Campylobacter diarrhea     a. 05/25/14    Current Outpatient Prescriptions  Medication Sig Dispense Refill  . acetaminophen (TYLENOL) 325 MG tablet Take 2 tablets (650 mg total) by mouth every 4 (four) hours as needed for headache or mild pain.      Marland Kitchen apixaban (ELIQUIS) 5 MG TABS tablet Take 5 mg by mouth 2 (two) times daily.      Marland Kitchen diltiazem (CARDIZEM CD) 120 MG 24 hr capsule Take 1 capsule (120 mg total) by mouth daily.  30 capsule  6  . flecainide (TAMBOCOR) 100 MG tablet Take 1 tablet (100 mg total) by mouth every 12 (twelve) hours.  60 tablet  6  . hydroxypropyl methylcellulose (ISOPTO TEARS) 2.5 % ophthalmic solution Place 1 drop into both eyes daily.      . Multiple Vitamin (MULTIVITAMIN WITH MINERALS) TABS Take 1 tablet  by mouth every evening.       . rosuvastatin (CRESTOR) 10 MG tablet Take 5 mg by mouth every evening.       No current facility-administered medications for this visit.     Allergies:   Review of patient's allergies indicates no known allergies.   Social History:  The patient  reports that he has never smoked. He has never used smokeless tobacco. He reports that he drinks about 7 ounces of alcohol per week. He reports that he does not use illicit drugs.   Family History:  The patient's family history includes Dementia in his mother; Heart disease in his brother; Parkinson's disease in his father.   ROS:  Please see the history of present illness.       All other systems reviewed and negative.   PHYSICAL EXAM: VS:  BP 140/80  Pulse 54  Ht 5\' 11"  (1.803 m)  Wt 191 lb (86.637 kg)  BMI 26.65 kg/m2 Well nourished, well developed, in no acute distress HEENT: normal Neck: no JVD Cardiac:  normal S1, S2; RR; no murmur Lungs:  clear to auscultation bilaterally, no wheezing, rhonchi or rales Abd: soft, nontender, no hepatomegaly Ext: no edema Skin: warm and dry Neuro:  CNs 2-12 intact, no focal abnormalities noted  EKG:  Sinus bradycardia, HR 54, normal axis, no ST changes     ASSESSMENT AND PLAN:  Paroxysmal atrial fibrillation/flutter:  Maintaining NSR. He is tolerating flecainide, diltiazem and Eliquis. Recent ETT-Myoview was negative for ischemia and negative for pro-arrhythmia.  Patient does have a history of snoring. There was some concern for sleep apnea in the hospital. Sleep testing is being arranged.  Unspecified essential hypertension:  Fair control. Continue to monitor.  Hyperlipemia:  Continue statin. Managed by primary care.    Disposition:  Follow up with Dr. Aundra Dubin in 3 months.   Signed, [name], MHS 07/02/2014 10:25 AM    [facility] [place], [place], [place]  [address] Phone: [phone]; Fax: [phone]

## 2014-07-02 NOTE — Telephone Encounter (Signed)
Called luke around lunch time today and informed him to call patient to proceed with home sleep study. Lurena Joiner will contact patient to arrange

## 2014-07-03 ENCOUNTER — Other Ambulatory Visit: Payer: Self-pay

## 2014-07-03 ENCOUNTER — Other Ambulatory Visit: Payer: Self-pay | Admitting: *Deleted

## 2014-07-03 ENCOUNTER — Telehealth: Payer: Self-pay

## 2014-07-03 MED ORDER — APIXABAN 5 MG PO TABS: 5.0000 mg | ORAL_TABLET | Freq: Two times a day (BID) | ORAL | Status: DC

## 2014-07-03 NOTE — Telephone Encounter (Signed)
Would see how much Xarelto 20 mg daily would cost him.  Could switch to this.

## 2014-07-03 NOTE — Telephone Encounter (Signed)
Pt is aware PA was done. He will stay on the Eliquis Horton Chin RN

## 2014-07-03 NOTE — Telephone Encounter (Signed)
Patient called states that his eliquis cost was 360.00 and that he could not pay this amount.gave him samples

## 2014-07-03 NOTE — Telephone Encounter (Signed)
Patient needed a PA done on his eliquis that is why it was 360.00 dollars and I called and got this done

## 2014-07-03 NOTE — Telephone Encounter (Signed)
Called For Patient to get a PA on his eliquis and it was approved until 07/04/2015. JSR#[id number]. Called the patient to let him know and he understood

## 2014-07-27 ENCOUNTER — Telehealth: Payer: Self-pay | Admitting: Cardiology

## 2014-07-27 NOTE — Telephone Encounter (Signed)
Pt aware I have scheduled  appt 07/28/14 3 PM with Richardson Dopp, PAc.

## 2014-07-27 NOTE — Telephone Encounter (Signed)
Pt has a history of afib. Resting heart rate has been in the low to mid 50s. Pt states over the weekend his heart rate was in the 80-90s, one time in the 100s.   Pt has been doing his usual exercise over the last 2-3 days without problems. In general he feels a little bit different and is requesting appt tomorrow.

## 2014-07-27 NOTE — Telephone Encounter (Signed)
New message    Patient calling    C/O over the weekend resting heart rate elevated. Would like to have someone to check it out on tomorrow.

## 2014-07-28 ENCOUNTER — Ambulatory Visit (INDEPENDENT_AMBULATORY_CARE_PROVIDER_SITE_OTHER): Payer: Medicare Other | Admitting: Physician Assistant

## 2014-07-28 ENCOUNTER — Encounter (HOSPITAL_COMMUNITY): Payer: Self-pay

## 2014-07-28 ENCOUNTER — Encounter: Payer: Self-pay | Admitting: *Deleted

## 2014-07-28 ENCOUNTER — Encounter: Payer: Self-pay | Admitting: Physician Assistant

## 2014-07-28 VITALS — BP 130/88 | HR 79 | Ht 71.0 in | Wt 193.0 lb

## 2014-07-28 DIAGNOSIS — I10 Essential (primary) hypertension: Secondary | ICD-10-CM

## 2014-07-28 DIAGNOSIS — E785 Hyperlipidemia, unspecified: Secondary | ICD-10-CM

## 2014-07-28 DIAGNOSIS — I4891 Unspecified atrial fibrillation: Secondary | ICD-10-CM

## 2014-07-28 DIAGNOSIS — I4892 Unspecified atrial flutter: Secondary | ICD-10-CM

## 2014-07-28 NOTE — Patient Instructions (Signed)
Your physician has recommended that you have a Cardioversion (DCCV). Electrical Cardioversion uses a jolt of electricity to your heart either through paddles or wired patches attached to your chest. This is a controlled, usually prescheduled, procedure. Defibrillation is done under light anesthesia in the hospital, and you usually go home the day of the procedure. This is done to get your heart back into a normal rhythm. You are not awake for the procedure. Please see the instruction sheet given to you today.  Your physician recommends that you schedule a follow-up appointment with Dr. Rayann Heman for treatment of your atrial fibrillation.

## 2014-07-28 NOTE — Progress Notes (Signed)
Cardiology Office Note    Date:  07/28/2014   ID:  William Combs, DOB [birth year], MRN [id number]  PCP:  Henrine Screws, MD  Cardiologist:  Dr. [name]   Electrophysiologist:  Clarnce Flock both Dr. Cristopher Peru and Dr. Virl Axe in the hospital   History of Present Illness: William Combs is a 68 y.o. male with a hx of HTN, HL, prostate CA s/p XRT seed implant.  He was admitted 04/2014 with atrial fibrillation/flutter with RVR in the setting of Campylobacter gastroenteritis with associated AKI.  CHADS2-VASc=2 (HTN, age).  He was placed on Eliquis.  He underwent TEE guided cardioversion with restoration of NSR.  Readmitted 05/2014 with recurrent atrial flutter with RVR. He had stopped his diltiazem and decreased his Eliquis to once a day.  He was seen by Dr. Caryl Comes who placed him on Flecainide.  He subsequently underwent repeat TEE/DCCV with restoration of NSR. FU Treadmill test was negative for pro-arrhythmia and Nuclear images were negative for ischemia.   I saw him 07/02/14 in FU.  He called in recently b/c he felt he had gone back into AFib.  He presents for evaluation.  His HR is usually in the 67s.  He noted that his HR was in the 80s over the weekend.  He feels a little more fatigued.  He notes a "strange" sensation in his chest.  Denies significant dyspnea.  Denies orthopnea, PND, edema.  No syncope.  No palpitations.     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 Hypertension   . BPH (benign prostatic hyperplasia)   . Hyperlipidemia   . Prostate cancer DX  12/16/13    gleason 3+3=6, volume 48 gm, s/p seed implant 02/2014.  Marland Kitchen Arthritis     JOINTS  . History of asthma     EXERCISE INDUCED ASTHMA  1996 FOR 6 MONTHS  . Atrial fibrillation     a. 05/25/14 in the setting of campylobacter infection and diarrhea, TEE w/ cardioversion on 7/2. on eliquis and diltiazem  . Campylobacter diarrhea     a. 05/25/14    Current Outpatient Prescriptions  Medication Sig Dispense Refill  . apixaban (ELIQUIS) 5 MG TABS tablet Take 1 tablet (5 mg total) by mouth 2 (two) times daily.  60 tablet  3  . diltiazem (CARDIZEM CD) 120 MG 24 hr capsule Take 1 capsule (120 mg total) by mouth daily.  30 capsule  6  . flecainide (TAMBOCOR) 100 MG tablet Take 1 tablet (100 mg total) by mouth every 12 (twelve) hours.  60 tablet  6  . hydroxypropyl methylcellulose (ISOPTO TEARS) 2.5 % ophthalmic solution Place 1 drop into both eyes daily.      . Multiple Vitamin (MULTIVITAMIN WITH MINERALS) TABS Take 1 tablet by mouth every evening.       Marland Kitchen  rosuvastatin (CRESTOR) 10 MG tablet Take 5 mg by mouth every evening.       No current facility-administered medications for this visit.     Allergies:   Review of patient's allergies indicates no known allergies.   Social History:  The patient  reports that he has never smoked. He has never used smokeless tobacco. He reports that he drinks about 7 ounces of alcohol per week. He reports that he does not use illicit drugs.   Family History:  The patient's family history includes Dementia in his mother; Heart disease in his brother; Hypertension in his father; Parkinson's disease in his father. There is no history of Heart attack.   ROS:  Please see the history of present illness.   Denies any bleeding issues.   All other systems reviewed  and negative.   PHYSICAL EXAM: VS:  BP 130/88  Pulse 79  Ht 5\' 11"  (1.803 m)  Wt 193 lb (87.544 kg)  BMI 26.93 kg/m2 Well nourished, well developed, in no acute distress HEENT: normal Neck: no JVD Cardiac:  normal S1, S2; irreg irreg ; no murmur Lungs:  clear to auscultation bilaterally, no wheezing, rhonchi or rales Abd: soft, nontender, no hepatomegaly Ext: no edema Skin: warm and dry Neuro:  CNs 2-12 intact, no focal abnormalities noted  EKG:  AFlutter, HR 79     ASSESSMENT AND PLAN:  1. Paroxysmal atrial fibrillation/flutter:  He is back in AFlutter.  He is minimally symptomatic.  Reviewed with Dr. Virl Axe (EP in office today).  Will arrange DCCV tomorrow to restore NSR (he has not missed any Eliquis since his last admission to the hospital).  We will then arrange referral to Dr. Thompson Grayer to discuss possible AFib and AFlutter ablation.  Continue current dose of Diltiazem and Flecainide.   2. Unspecified essential hypertension:  Controlled.  3. Hyperlipemia:  Continue statin. Managed by primary care.  4. Disposition:  Schedule DCCV tomorrow.  Refer to Dr. Thompson Grayer as noted.  FU with Dr. Aundra Dubin as planned.   Signed, [name], MHS 07/28/2014 3:29 PM    [facility] [place], [place], [place]  [address] Phone: [phone]; Fax: [phone]

## 2014-07-28 NOTE — Addendum Note (Signed)
Addended byKathlen Mody, Nicki Reaper T on: 07/28/2014 04:43 PM   Modules accepted: Orders

## 2014-07-28 NOTE — H&P (Signed)
History and Physical    Date:  07/28/2014   ID:  William Combs, DOB February 25, 1946, MRN [id number]  PCP:  Henrine Screws, MD  Cardiologist:  Dr. [name]   Electrophysiologist:  Clarnce Flock both Dr. Cristopher Peru and Dr. Virl Axe in the hospital   History of Present Illness: William Combs is a 68 y.o. male with a hx of HTN, HL, prostate CA s/p XRT seed implant.  He was admitted 04/2014 with atrial fibrillation/flutter with RVR in the setting of Campylobacter gastroenteritis with associated AKI.  CHADS2-VASc=2 (HTN, age).  He was placed on Eliquis.  He underwent TEE guided cardioversion with restoration of NSR.  Readmitted 05/2014 with recurrent atrial flutter with RVR. He had stopped his diltiazem and decreased his Eliquis to once a day.  He was seen by Dr. Caryl Comes who placed him on Flecainide.  He subsequently underwent repeat TEE/DCCV with restoration of NSR. FU Treadmill test was negative for pro-arrhythmia and Nuclear images were negative for ischemia.   I saw him 07/02/14 in FU.  He called in recently b/c he felt he had gone back into AFib.  He presents for evaluation.  His HR is usually in the 73s.  He noted that his HR was in the 80s over the weekend.  He feels a little more fatigued.  He notes a "strange" sensation in his chest.  Denies significant dyspnea.  Denies orthopnea, PND, edema.  No syncope.  No palpitations.     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 Hypertension   . BPH (benign prostatic hyperplasia)   . Hyperlipidemia   . Prostate cancer DX  12/16/13    gleason 3+3=6, volume 48 gm, s/p seed implant 02/2014.  Marland Kitchen Arthritis     JOINTS  . History of asthma     EXERCISE INDUCED ASTHMA  1996 FOR 6 MONTHS  . Atrial fibrillation     a. 05/25/14 in the setting of campylobacter infection and diarrhea, TEE w/ cardioversion on 7/2. on eliquis and diltiazem  . Campylobacter diarrhea     a. 05/25/14    Current Outpatient Prescriptions  Medication Sig Dispense Refill  . apixaban (ELIQUIS) 5 MG TABS tablet Take 1 tablet (5 mg total) by mouth 2 (two) times daily.  60 tablet  3  . diltiazem (CARDIZEM CD) 120 MG 24 hr capsule Take 1 capsule (120 mg total) by mouth daily.  30 capsule  6  . flecainide (TAMBOCOR) 100 MG tablet Take 1 tablet (100 mg total) by mouth every 12 (twelve) hours.  60 tablet  6  . hydroxypropyl methylcellulose (ISOPTO TEARS) 2.5 % ophthalmic solution Place 1 drop into both eyes daily.      . Multiple Vitamin (MULTIVITAMIN WITH MINERALS) TABS Take 1 tablet by mouth every evening.       Marland Kitchen  rosuvastatin (CRESTOR) 10 MG tablet Take 5 mg by mouth every evening.       No current facility-administered medications for this visit.     Allergies:   Review of patient's allergies indicates no known allergies.   Social History:  The patient  reports that he has never smoked. He has never used smokeless tobacco. He reports that he drinks about 7 ounces of alcohol per week. He reports that he does not use illicit drugs.   Family History:  The patient's family history includes Dementia in his mother; Heart disease in his brother; Hypertension in his father; Parkinson's disease in his father. There is no history of Heart attack.   ROS:  Please see the history of present illness.   Denies any bleeding issues.   All other systems reviewed and negative.    PHYSICAL EXAM: VS:  BP 130/88  Pulse 79  Ht 5\' 11"  (1.803 m)  Wt 193 lb (87.544 kg)  BMI 26.93 kg/m2 Well nourished, well developed, in no acute distress HEENT: normal Neck: no JVD Cardiac:  normal S1, S2; irreg irreg ; no murmur Lungs:  clear to auscultation bilaterally, no wheezing, rhonchi or rales Abd: soft, nontender, no hepatomegaly Ext: no edema Skin: warm and dry Neuro:  CNs 2-12 intact, no focal abnormalities noted  EKG:  AFlutter, HR 79     ASSESSMENT AND PLAN:  Paroxysmal atrial fibrillation/flutter:  He is back in AFlutter. He is minimally symptomatic. Reviewed with Dr. Virl Axe (EP in office today).  Will arrange DCCV tomorrow to restore NSR (he has not missed any Eliquis since his last admission to the hospital).  We will then arrange referral to Dr. Thompson Grayer to discuss possible AFib and AFlutter ablation.  Continue current dose of Diltiazem and Flecainide.    Unspecified essential hypertension:  Controlled.   Hyperlipemia:  Continue statin. Managed by primary care.    Disposition:  Schedule DCCV tomorrow.  Refer to Dr. Thompson Grayer as noted.  FU with Dr. Aundra Dubin as planned.   Signed, [name], MHS 07/28/2014 3:29 PM    [facility] [place], [place], [place]  [address] Phone: [phone]; Fax: [phone]

## 2014-07-29 ENCOUNTER — Encounter (HOSPITAL_COMMUNITY): Payer: Self-pay | Admitting: Certified Registered Nurse Anesthetist

## 2014-07-29 ENCOUNTER — Encounter (HOSPITAL_COMMUNITY)
Admission: RE | Disposition: A | Discharge status: Home / Self Care | Payer: Self-pay | Source: Ambulatory Visit | Attending: Cardiology

## 2014-07-29 ENCOUNTER — Encounter (HOSPITAL_COMMUNITY): Payer: Medicare Other | Admitting: Certified Registered Nurse Anesthetist

## 2014-07-29 ENCOUNTER — Ambulatory Visit (HOSPITAL_COMMUNITY): Payer: Medicare Other | Admitting: Certified Registered Nurse Anesthetist

## 2014-07-29 ENCOUNTER — Ambulatory Visit (HOSPITAL_COMMUNITY)
Admission: RE | Admit: 2014-07-29 | Discharge: 2014-07-29 | Disposition: A | Discharge status: Home / Self Care | Payer: Medicare Other | Source: Ambulatory Visit | Attending: Cardiology | Admitting: Cardiology

## 2014-07-29 DIAGNOSIS — Z79899 Other long term (current) drug therapy: Secondary | ICD-10-CM | POA: Insufficient documentation

## 2014-07-29 DIAGNOSIS — I10 Essential (primary) hypertension: Secondary | ICD-10-CM | POA: Insufficient documentation

## 2014-07-29 DIAGNOSIS — I4891 Unspecified atrial fibrillation: Secondary | ICD-10-CM | POA: Diagnosis not present

## 2014-07-29 DIAGNOSIS — I4892 Unspecified atrial flutter: Secondary | ICD-10-CM

## 2014-07-29 DIAGNOSIS — Z8546 Personal history of malignant neoplasm of prostate: Secondary | ICD-10-CM | POA: Insufficient documentation

## 2014-07-29 DIAGNOSIS — E785 Hyperlipidemia, unspecified: Secondary | ICD-10-CM | POA: Diagnosis not present

## 2014-07-29 DIAGNOSIS — Z923 Personal history of irradiation: Secondary | ICD-10-CM | POA: Insufficient documentation

## 2014-07-29 HISTORY — PX: CARDIOVERSION: SHX1299

## 2014-07-29 LAB — POCT I-STAT 4, (NA,K, GLUC, HGB,HCT)
Glucose, Bld: 89 mg/dL (ref 70–99)
HCT: 44 % (ref 39.0–52.0)
HEMOGLOBIN: 15 g/dL (ref 13.0–17.0)
Potassium: 5.4 mEq/L — ABNORMAL HIGH (ref 3.7–5.3)
SODIUM: 139 meq/L (ref 137–147)

## 2014-07-29 SURGERY — CARDIOVERSION
Anesthesia: Monitor Anesthesia Care

## 2014-07-29 MED ORDER — SODIUM CHLORIDE 0.9 % IV SOLN: 250.0000 mL | INTRAVENOUS | Status: DC

## 2014-07-29 MED ORDER — SODIUM CHLORIDE 0.9 % IJ SOLN: 3.0000 mL | INTRAMUSCULAR | Status: DC | PRN

## 2014-07-29 MED ORDER — SODIUM CHLORIDE 0.9 % IJ SOLN: 3.0000 mL | Freq: Two times a day (BID) | INTRAMUSCULAR | Status: DC

## 2014-07-29 MED ORDER — SODIUM CHLORIDE 0.9 % IV SOLN
INTRAVENOUS | Status: DC | PRN
  Administered 2014-07-29: 09:00:00 via INTRAVENOUS

## 2014-07-29 MED ORDER — PROPOFOL 10 MG/ML IV BOLUS
INTRAVENOUS | Status: DC | PRN
  Administered 2014-07-29: 100 mg via INTRAVENOUS

## 2014-07-29 MED ORDER — LIDOCAINE HCL (CARDIAC) 20 MG/ML IV SOLN
INTRAVENOUS | Status: DC | PRN
  Administered 2014-07-29: 50 mg via INTRAVENOUS

## 2014-07-29 NOTE — Transfer of Care (Signed)
Immediate Anesthesia Transfer of Care Note  Patient: William Combs  Procedure(s) Performed: Procedure(s): CARDIOVERSION (N/A)  Patient Location: Endoscopy Unit  Anesthesia Type:MAC  Level of Consciousness: awake, oriented, patient cooperative and lethargic  Airway & Oxygen Therapy: Patient Spontanous Breathing and Patient connected to nasal cannula oxygen  Post-op Assessment: Report given to PACU RN, Post -op Vital signs reviewed and stable and Patient moving all extremities  Post vital signs: Reviewed and stable  Complications: No apparent anesthesia complications

## 2014-07-29 NOTE — Anesthesia Procedure Notes (Signed)
Procedure Name: MAC Date/Time: 07/29/2014 9:21 AM Performed by: Ned Grace Pre-anesthesia Checklist: Patient identified Patient Re-evaluated:Patient Re-evaluated prior to inductionOxygen Delivery Method: Ambu bag Preoxygenation: Pre-oxygenation with 100% oxygen Intubation Type: IV induction Ventilation: Mask ventilation without difficulty

## 2014-07-29 NOTE — Interval H&P Note (Signed)
History and Physical Interval Note:  07/29/2014 9:17 AM  William Combs  has presented today for surgery, with the diagnosis of AFIB  The various methods of treatment have been discussed with the patient and family. After consideration of risks, benefits and other options for treatment, the patient has consented to  Procedure(s): CARDIOVERSION (N/A) as a surgical intervention .  The patient's history has been reviewed, patient examined, no change in status, stable for surgery.  I have reviewed the patient's chart and labs.  Questions were answered to the patient's satisfaction.     Eitan Doubleday [employer]

## 2014-07-29 NOTE — Anesthesia Preprocedure Evaluation (Addendum)
Anesthesia Evaluation  Patient identified by MRN, date of birth, ID band Patient awake    Reviewed: Allergy & Precautions, H&P , NPO status , Patient's Chart, lab work & pertinent test results  History of Anesthesia Complications Negative for: history of anesthetic complications  Airway Mallampati: II TM Distance: >3 FB Neck ROM: Full    Dental  (+) Teeth Intact, Dental Advisory Given   Pulmonary neg pulmonary ROS,  breath sounds clear to auscultation        Cardiovascular hypertension, Pt. on medications Rhythm:Irregular Rate:Normal     Neuro/Psych negative neurological ROS  negative psych ROS   GI/Hepatic negative GI ROS, Neg liver ROS,   Endo/Other    Renal/GU Renal disease     Musculoskeletal   Abdominal   Peds  Hematology   Anesthesia Other Findings   Reproductive/Obstetrics                          Anesthesia Physical Anesthesia Plan  ASA: III  Anesthesia Plan: General   Post-op Pain Management:    Induction: Intravenous  Airway Management Planned: Natural Airway and Mask  Additional Equipment:   Intra-op Plan:   Post-operative Plan:   Informed Consent: I have reviewed the patients History and Physical, chart, labs and discussed the procedure including the risks, benefits and alternatives for the proposed anesthesia with the patient or authorized representative who has indicated his/her understanding and acceptance.   Dental advisory given  Plan Discussed with: CRNA, Anesthesiologist and Surgeon  Anesthesia Plan Comments:        Anesthesia Quick Evaluation

## 2014-07-29 NOTE — Anesthesia Postprocedure Evaluation (Signed)
  Anesthesia Post-op Note  Patient: William Combs  Procedure(s) Performed: Procedure(s): CARDIOVERSION (N/A)  Patient Location: Endoscopy Unit  Anesthesia Type:MAC  Level of Consciousness: awake, alert , oriented and patient cooperative  Airway and Oxygen Therapy: Patient Spontanous Breathing and Patient connected to nasal cannula oxygen  Post-op Pain: none  Post-op Assessment: Post-op Vital signs reviewed, Patient's Cardiovascular Status Stable, Respiratory Function Stable and Patent Airway  Post-op Vital Signs: Reviewed and stable  Last Vitals:  Filed Vitals:   07/29/14 0810  BP: 151/95  Pulse: 74  Resp: 16    Complications: No apparent anesthesia complications

## 2014-07-29 NOTE — H&P (View-Only) (Signed)
Cardiology Office Note    Date:  07/28/2014   ID:  William Combs, DOB 10/15/1946, MRN [id number]  PCP:  Henrine Screws, MD  Cardiologist:  Dr. [name]   Electrophysiologist:  Clarnce Flock both Dr. Cristopher Peru and Dr. Virl Axe in the hospital   History of Present Illness: William Combs is a 68 y.o. male with a hx of HTN, HL, prostate CA s/p XRT seed implant.  He was admitted 04/2014 with atrial fibrillation/flutter with RVR in the setting of Campylobacter gastroenteritis with associated AKI.  CHADS2-VASc=2 (HTN, age).  He was placed on Eliquis.  He underwent TEE guided cardioversion with restoration of NSR.  Readmitted 05/2014 with recurrent atrial flutter with RVR. He had stopped his diltiazem and decreased his Eliquis to once a day.  He was seen by Dr. Caryl Comes who placed him on Flecainide.  He subsequently underwent repeat TEE/DCCV with restoration of NSR. FU Treadmill test was negative for pro-arrhythmia and Nuclear images were negative for ischemia.   I saw him 07/02/14 in FU.  He called in recently b/c he felt he had gone back into AFib.  He presents for evaluation.  His HR is usually in the 19s.  He noted that his HR was in the 80s over the weekend.  He feels a little more fatigued.  He notes a "strange" sensation in his chest.  Denies significant dyspnea.  Denies orthopnea, PND, edema.  No syncope.  No palpitations.     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 Hypertension   . BPH (benign prostatic hyperplasia)   . Hyperlipidemia   . Prostate cancer DX  12/16/13    gleason 3+3=6, volume 48 gm, s/p seed implant 02/2014.  Marland Kitchen Arthritis     JOINTS  . History of asthma     EXERCISE INDUCED ASTHMA  1996 FOR 6 MONTHS  . Atrial fibrillation     a. 05/25/14 in the setting of campylobacter infection and diarrhea, TEE w/ cardioversion on 7/2. on eliquis and diltiazem  . Campylobacter diarrhea     a. 05/25/14    Current Outpatient Prescriptions  Medication Sig Dispense Refill  . apixaban (ELIQUIS) 5 MG TABS tablet Take 1 tablet (5 mg total) by mouth 2 (two) times daily.  60 tablet  3  . diltiazem (CARDIZEM CD) 120 MG 24 hr capsule Take 1 capsule (120 mg total) by mouth daily.  30 capsule  6  . flecainide (TAMBOCOR) 100 MG tablet Take 1 tablet (100 mg total) by mouth every 12 (twelve) hours.  60 tablet  6  . hydroxypropyl methylcellulose (ISOPTO TEARS) 2.5 % ophthalmic solution Place 1 drop into both eyes daily.      . Multiple Vitamin (MULTIVITAMIN WITH MINERALS) TABS Take 1 tablet by mouth every evening.       Marland Kitchen  rosuvastatin (CRESTOR) 10 MG tablet Take 5 mg by mouth every evening.       No current facility-administered medications for this visit.     Allergies:   Review of patient's allergies indicates no known allergies.   Social History:  The patient  reports that he has never smoked. He has never used smokeless tobacco. He reports that he drinks about 7 ounces of alcohol per week. He reports that he does not use illicit drugs.   Family History:  The patient's family history includes Dementia in his mother; Heart disease in his brother; Hypertension in his father; Parkinson's disease in his father. There is no history of Heart attack.   ROS:  Please see the history of present illness.   Denies any bleeding issues.   All other systems reviewed  and negative.   PHYSICAL EXAM: VS:  BP 130/88  Pulse 79  Ht 5\' 11"  (1.803 m)  Wt 193 lb (87.544 kg)  BMI 26.93 kg/m2 Well nourished, well developed, in no acute distress HEENT: normal Neck: no JVD Cardiac:  normal S1, S2; irreg irreg ; no murmur Lungs:  clear to auscultation bilaterally, no wheezing, rhonchi or rales Abd: soft, nontender, no hepatomegaly Ext: no edema Skin: warm and dry Neuro:  CNs 2-12 intact, no focal abnormalities noted  EKG:  AFlutter, HR 79     ASSESSMENT AND PLAN:  1. Paroxysmal atrial fibrillation/flutter:  He is back in AFlutter.  He is minimally symptomatic.  Reviewed with Dr. Virl Axe (EP in office today).  Will arrange DCCV tomorrow to restore NSR (he has not missed any Eliquis since his last admission to the hospital).  We will then arrange referral to Dr. Thompson Grayer to discuss possible AFib and AFlutter ablation.  Continue current dose of Diltiazem and Flecainide.   2. Unspecified essential hypertension:  Controlled.  3. Hyperlipemia:  Continue statin. Managed by primary care.  4. Disposition:  Schedule DCCV tomorrow.  Refer to Dr. Thompson Grayer as noted.  FU with Dr. Aundra Dubin as planned.   Signed, [name], MHS 07/28/2014 3:29 PM    [facility], [place], [place]  [address] Phone: [phone]; Fax: [phone]

## 2014-07-29 NOTE — Procedures (Signed)
Electrical Cardioversion Procedure Note William Combs [id number] Nov 14, 1946  Procedure: Electrical Cardioversion Indications:  Atrial Flutter.  He has been on Eliquis > 1 month.   Procedure Details Consent: Risks of procedure as well as the alternatives and risks of each were explained to the (patient/caregiver).  Consent for procedure obtained. Time Out: Verified patient identification, verified procedure, site/side was marked, verified correct patient position, special equipment/implants available, medications/allergies/relevent history reviewed, required imaging and test results available.  Performed  Patient placed on cardiac monitor, pulse oximetry, supplemental oxygen as necessary.  Sedation given: Propofol per anesthesiology Pacer pads placed anterior and posterior chest.  Cardioverted 1 time(s).  Cardioverted at 150J.  Evaluation Findings: Post procedure EKG shows: NSR Complications: None Patient did tolerate procedure well.   [name] 07/29/2014, 9:24 AM

## 2014-07-29 NOTE — Discharge Instructions (Signed)

## 2014-07-30 ENCOUNTER — Encounter (HOSPITAL_COMMUNITY): Payer: Self-pay | Admitting: Cardiology

## 2014-08-07 ENCOUNTER — Encounter: Payer: Self-pay | Admitting: Internal Medicine

## 2014-08-07 ENCOUNTER — Telehealth: Payer: Self-pay | Admitting: *Deleted

## 2014-08-07 DIAGNOSIS — G4730 Sleep apnea, unspecified: Secondary | ICD-10-CM

## 2014-08-07 NOTE — Telephone Encounter (Signed)
lmtcb  (need to inform patient abnomal sleep study results.  AHI 54.9 - severe. Needs referral to Dr. Gwenette Greet)

## 2014-08-07 NOTE — Telephone Encounter (Signed)
Notified patient of results and explained that someone would be contacting him to arrange referral to pulmonologist, Dr. Gwenette Greet. Patient verbalized understanding and agreeable to plan.

## 2014-08-07 NOTE — Addendum Note (Signed)
Addended by: Stanton Kidney on: 08/07/2014 01:57 PM   Modules accepted: Orders

## 2014-08-07 NOTE — Telephone Encounter (Signed)
Patient is returning your call. Please call back.  °

## 2014-08-24 ENCOUNTER — Ambulatory Visit (INDEPENDENT_AMBULATORY_CARE_PROVIDER_SITE_OTHER): Payer: Medicare Other | Admitting: Internal Medicine

## 2014-08-24 ENCOUNTER — Encounter: Payer: Self-pay | Admitting: *Deleted

## 2014-08-24 ENCOUNTER — Encounter: Payer: Self-pay | Admitting: Internal Medicine

## 2014-08-24 VITALS — BP 130/88 | HR 59 | Ht 71.5 in | Wt 193.2 lb

## 2014-08-24 DIAGNOSIS — I4892 Unspecified atrial flutter: Secondary | ICD-10-CM

## 2014-08-24 DIAGNOSIS — G4730 Sleep apnea, unspecified: Secondary | ICD-10-CM

## 2014-08-24 DIAGNOSIS — I4891 Unspecified atrial fibrillation: Secondary | ICD-10-CM

## 2014-08-24 DIAGNOSIS — Z01812 Encounter for preprocedural laboratory examination: Secondary | ICD-10-CM

## 2014-08-24 DIAGNOSIS — Z7289 Other problems related to lifestyle: Secondary | ICD-10-CM

## 2014-08-24 DIAGNOSIS — Z789 Other specified health status: Secondary | ICD-10-CM | POA: Insufficient documentation

## 2014-08-24 DIAGNOSIS — I483 Typical atrial flutter: Secondary | ICD-10-CM

## 2014-08-24 DIAGNOSIS — I119 Hypertensive heart disease without heart failure: Secondary | ICD-10-CM

## 2014-08-24 DIAGNOSIS — G478 Other sleep disorders: Secondary | ICD-10-CM

## 2014-08-24 NOTE — Patient Instructions (Signed)
Your physician has requested that you have a TEE. During a TEE, sound waves are used to create images of your heart. It provides your doctor with information about the size and shape of your heart and how well your heart's chambers and valves are working. In this test, a transducer is attached to the end of a flexible tube that's guided down your throat and into your esophagus (the tube leading from you mouth to your stomach) to get a more detailed image of your heart. You are not awake for the procedure. Please see the instruction sheet given to you today. For further information please visit [email or web address].   Your physician has recommended that you have an ablation. Catheter ablation is a medical procedure used to treat some cardiac arrhythmias (irregular heartbeats). During catheter ablation, a long, thin, flexible tube is put into a blood vessel in your groin (upper thigh), or neck. This tube is called an ablation catheter. It is then guided to your heart through the blood vessel. Radio frequency waves destroy small areas of heart tissue where abnormal heartbeats may cause an arrhythmia to start. Please see the instruction sheet given to you today.

## 2014-08-24 NOTE — Progress Notes (Signed)
Primary Care Physician: William Screws, MD Referring Physician:  Dr William Combs Primary EP:  William Combs is a 68 y.o. male with a h/o persistent afib first dx in June of this year when hospitalized with a gastroenteritis.  He underwent TEE DCCV and d/c on eliquis and Cardizem. He then stopped his Cardizem and was taking eliquis irregularly and went back into afib/flutter and again went thru TEE DCCV. He began taking his meds on a regular basis and first of September noted his heart rate was in the upper 80's instead of low 60's. He was found to be in typical aflutter and underwent DCCV. He is now on flecanide and cardizem and is being faithful with Eliquis, maintaining NSR. Here today to discuss afib/flutter ablation.  He does drink 2-3 alcoholic drinks a night and recent sleep study showed severe sleep apnea. Appointment with William. Gwenette Combs 11/3.  Today, he denies symptoms of palpitations, chest pain, shortness of breath, orthopnea, PND, lower extremity edema, dizziness, presyncope, syncope, or neurologic sequela. The patient is tolerating medications without difficulties and is otherwise without complaint today.   Past Medical History  Diagnosis Date  . Hypertension   . BPH (benign prostatic hyperplasia)   . Hyperlipidemia   . Prostate cancer DX  12/16/13    gleason 3+3=6, volume 48 gm, s/p seed implant 02/2014.  Marland Kitchen Arthritis     JOINTS  . History of asthma     EXERCISE INDUCED ASTHMA  1996 FOR 6 MONTHS  . Atrial fibrillation     a. 05/25/14 in the setting of campylobacter infection and diarrhea, TEE w/ cardioversion on 7/2. on eliquis and diltiazem  . Campylobacter diarrhea     a. 05/25/14   Past Surgical History  Procedure Laterality Date  . Knee arthroscopy  09/25/2012    Procedure: ARTHROSCOPY KNEE;  Surgeon: William Alf, MD;  Location: [facility];  Service: Orthopedics;  Laterality: Left;  with medial and lateral meniscus Debridement  . Prostate biopsy  12/16/13    Gleason  6, volume 48 gm  . Right knee surgery  1975  . Radioactive seed implant N/A 03/18/2014    Procedure: RADIOACTIVE SEED IMPLANT;  Surgeon: William Amass, MD;  Location: [facility];  Service: Urology;  Laterality: N/A;  . Tee without cardioversion N/A 05/28/2014    Procedure: TRANSESOPHAGEAL ECHOCARDIOGRAM (TEE);  Surgeon: William Perla, MD;  Location: [facility];  Service: Cardiovascular;  Laterality: N/A;  . Cardioversion N/A 05/28/2014    Procedure: CARDIOVERSION;  Surgeon: William Perla, MD;  Location: [facility] ENDOSCOPY;  Service: Cardiovascular;  Laterality: N/A;  . Tee without cardioversion N/A 06/09/2014    Procedure: TRANSESOPHAGEAL ECHOCARDIOGRAM (TEE);  Surgeon: William Casino, MD;  Location: [facility] ENDOSCOPY;  Service: Cardiovascular;  Laterality: N/A;  . Cardioversion N/A 06/09/2014    Procedure: CARDIOVERSION;  Surgeon: William Casino, MD;  Location: [facility] ENDOSCOPY;  Service: Cardiovascular;  Laterality: N/A;  . Cardioversion N/A 07/29/2014    Procedure: CARDIOVERSION;  Surgeon: William Dresser, MD;  Location: [facility] ENDOSCOPY;  Service: Cardiovascular;  Laterality: N/A;    Current Outpatient Prescriptions  Medication Sig Dispense Refill  . apixaban (ELIQUIS) 5 MG TABS tablet Take 1 tablet (5 mg total) by mouth 2 (two) times daily.  60 tablet  3  . diltiazem (CARDIZEM CD) 120 MG 24 hr capsule Take 1 capsule (120 mg total) by mouth daily.  30 capsule  6  . flecainide (TAMBOCOR) 100 MG tablet Take 1 tablet (100  mg total) by mouth every 12 (twelve) hours.  60 tablet  6  . hydroxypropyl methylcellulose / hypromellose (ISOPTO TEARS / GONIOVISC) 2.5 % ophthalmic solution Place 1 drop into both eyes daily.      . Multiple Vitamin (MULTIVITAMIN WITH MINERALS) TABS Take 1 tablet by mouth every evening.       . naproxen sodium (ANAPROX) 220 MG tablet Take 440 mg by mouth daily as needed (pain).       . rosuvastatin (CRESTOR) 10 MG tablet Take 5 mg by mouth every evening.       No  current facility-administered medications for this visit.    No Known Allergies  History   Social History  . Marital Status: Married    Spouse Name: N/A    Number of Children: N/A  . Years of Education: N/A   Occupational History  . 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 Smoking status: Never Smoker   . Smokeless tobacco: Never Used  . Alcohol Use: 7.0 oz/week    14 drink(s) per week     Comment: 2-3 DRINKS DAILY (standard size)  . Drug Use: No  . Sexual Activity: Not on file   Other Topics Concern  . Not on file   Social History Narrative  . No narrative on file    Family History  Problem Relation Age of Onset  . Dementia Mother   . Parkinson's disease Father   . Heart disease Brother     Older brother had some sort of rhythm procedure, is now active afterwards  . Hypertension Father   . Heart attack Neg Hx     ROS- All systems are reviewed and negative except as per the HPI above  Physical Exam: Filed Vitals:   08/24/14 1128  BP: 130/88  Pulse: 59  Height: 5' 11.5" (1.816 m)  Weight: 193 lb 3.2 oz (87.635 kg)    GEN- The patient is well appearing, alert and oriented x 3 today.   Head- normocephalic, atraumatic Eyes-  Sclera clear, conjunctiva pink Ears- hearing intact Oropharynx- clear Neck- supple, no JVP Lymph- no cervical lymphadenopathy Lungs- Clear to ausculation bilaterally, normal work of breathing Heart- Regular rate and rhythm, no murmurs, rubs or gallops, PMI not laterally displaced GI- soft, NT, ND, + BS Extremities- no clubbing, cyanosis, or edema MS- no significant deformity or atrophy Skin- no rash or lesion Psych- euthymic mood, full affect Neuro- strength and sensation are intact  EKG- NSR at 59 bpm, QTc 471ms. Other prior Ekgs reviewed showing afib as well as typical aflutter. Epic records including William Combs/ William Combs/ William Combs notes are reviewed  Assessment and Plan: 1.  Symptomatic Persistent afib/aflutter failing flecainide Therapeutic strategies for afib and atrial flutter including medicine and ablation were discussed in detail with the patient today. Risk, benefits, and alternatives to EP study and radiofrequency ablation for afib were also discussed in detail today. These risks include but are not limited to stroke, bleeding, vascular damage, tamponade, perforation, damage to the esophagus, lungs, and other structures, pulmonary vein stenosis, radiation exposure, worsening renal function, and death. The patient understands these risk and wishes to proceed.  We will therefore proceed with catheter ablation.  We will schedule the procedure at the next available time. Has been taking eliquis regularly since the end of July with Chadsvasc of 2 (age/HTN)  2. Sleep Apnea Continue with f/u of positive sleep study with  William. Gwenette Combs as scheduled.  3. Moderate Alcohol consumption Encouraged to minimize use for better outcome to maintain NSR.  F/U after procedure

## 2014-09-08 ENCOUNTER — Other Ambulatory Visit (INDEPENDENT_AMBULATORY_CARE_PROVIDER_SITE_OTHER): Payer: Medicare Other | Admitting: *Deleted

## 2014-09-08 DIAGNOSIS — I4892 Unspecified atrial flutter: Secondary | ICD-10-CM

## 2014-09-08 DIAGNOSIS — I4891 Unspecified atrial fibrillation: Secondary | ICD-10-CM

## 2014-09-08 DIAGNOSIS — Z01812 Encounter for preprocedural laboratory examination: Secondary | ICD-10-CM

## 2014-09-08 LAB — CBC WITH DIFFERENTIAL/PLATELET
Basophils Absolute: 0 10*3/uL (ref 0.0–0.1)
Basophils Relative: 0.4 % (ref 0.0–3.0)
Eosinophils Absolute: 0.1 10*3/uL (ref 0.0–0.7)
Eosinophils Relative: 1.4 % (ref 0.0–5.0)
HCT: 42.8 % (ref 39.0–52.0)
Hemoglobin: 14.1 g/dL (ref 13.0–17.0)
Lymphocytes Relative: 33.1 % (ref 12.0–46.0)
Lymphs Abs: 1.8 10*3/uL (ref 0.7–4.0)
MCHC: 33 g/dL (ref 30.0–36.0)
MCV: 96.8 fl (ref 78.0–100.0)
Monocytes Absolute: 0.4 10*3/uL (ref 0.1–1.0)
Monocytes Relative: 8 % (ref 3.0–12.0)
Neutro Abs: 3.1 10*3/uL (ref 1.4–7.7)
Neutrophils Relative %: 57.1 % (ref 43.0–77.0)
Platelets: 178 10*3/uL (ref 150.0–400.0)
RBC: 4.42 Mil/uL (ref 4.22–5.81)
RDW: 12.9 % (ref 11.5–15.5)
WBC: 5.4 10*3/uL (ref 4.0–10.5)

## 2014-09-08 LAB — BASIC METABOLIC PANEL
BUN: 18 mg/dL (ref 6–23)
CHLORIDE: 106 meq/L (ref 96–112)
CO2: 23 mEq/L (ref 19–32)
Calcium: 9.4 mg/dL (ref 8.4–10.5)
Creatinine, Ser: 1 mg/dL (ref 0.4–1.5)
GFR: 77.88 mL/min (ref >60.00)
Glucose, Bld: 94 mg/dL (ref 70–99)
Potassium: 4.3 mEq/L (ref 3.5–5.1)
SODIUM: 139 meq/L (ref 135–145)

## 2014-09-08 LAB — PROTIME-INR
INR: 1.2 ratio — ABNORMAL HIGH (ref 0.8–1.0)
Prothrombin Time: 13 s (ref 9.6–13.1)

## 2014-09-09 ENCOUNTER — Encounter (HOSPITAL_COMMUNITY): Payer: Self-pay | Admitting: Pharmacy Technician

## 2014-09-09 MED ORDER — SODIUM CHLORIDE 0.9 % IV SOLN
INTRAVENOUS | Status: DC
Start: 2014-09-09 — End: 2017-10-01

## 2014-09-09 MED ORDER — SODIUM CHLORIDE 0.9 % IV SOLN: INTRAVENOUS | Status: DC

## 2014-09-10 ENCOUNTER — Encounter (HOSPITAL_COMMUNITY): Payer: Self-pay | Admitting: Anesthesiology

## 2014-09-10 SURGERY — ATRIAL FIBRILLATION ABLATION
Anesthesia: Monitor Anesthesia Care

## 2014-09-10 SURGERY — ECHOCARDIOGRAM, TRANSESOPHAGEAL
Anesthesia: Moderate Sedation

## 2014-09-11 ENCOUNTER — Telehealth: Payer: Self-pay | Admitting: Internal Medicine

## 2014-09-11 ENCOUNTER — Ambulatory Visit (HOSPITAL_COMMUNITY): Admission: RE | Admit: 2014-09-11 | Payer: Medicare Other | Source: Ambulatory Visit | Admitting: Internal Medicine

## 2014-09-11 ENCOUNTER — Encounter (HOSPITAL_COMMUNITY): Payer: Self-pay

## 2014-09-11 NOTE — Telephone Encounter (Signed)
New problem   Pt need a call back to r/s his procedure. Please call pt,

## 2014-09-11 NOTE — Telephone Encounter (Signed)
TEE sch for 10/19 and ablation sch for 10/20  [facility] aware

## 2014-09-14 ENCOUNTER — Encounter (HOSPITAL_COMMUNITY)
Admission: RE | Disposition: A | Discharge status: Home / Self Care | Payer: Self-pay | Source: Ambulatory Visit | Attending: Cardiology

## 2014-09-14 ENCOUNTER — Encounter (HOSPITAL_COMMUNITY): Payer: Self-pay | Admitting: Gastroenterology

## 2014-09-14 ENCOUNTER — Ambulatory Visit (HOSPITAL_COMMUNITY)
Admission: RE | Admit: 2014-09-14 | Discharge: 2014-09-14 | Disposition: A | Discharge status: Home / Self Care | Payer: Medicare Other | Source: Ambulatory Visit | Attending: Cardiology | Admitting: Cardiology

## 2014-09-14 DIAGNOSIS — E785 Hyperlipidemia, unspecified: Secondary | ICD-10-CM

## 2014-09-14 DIAGNOSIS — I10 Essential (primary) hypertension: Secondary | ICD-10-CM | POA: Insufficient documentation

## 2014-09-14 DIAGNOSIS — G4730 Sleep apnea, unspecified: Secondary | ICD-10-CM | POA: Insufficient documentation

## 2014-09-14 DIAGNOSIS — Z791 Long term (current) use of non-steroidal anti-inflammatories (NSAID): Secondary | ICD-10-CM | POA: Diagnosis not present

## 2014-09-14 DIAGNOSIS — Z79899 Other long term (current) drug therapy: Secondary | ICD-10-CM

## 2014-09-14 DIAGNOSIS — I481 Persistent atrial fibrillation: Principal | ICD-10-CM

## 2014-09-14 DIAGNOSIS — I4892 Unspecified atrial flutter: Secondary | ICD-10-CM | POA: Diagnosis not present

## 2014-09-14 DIAGNOSIS — Z8546 Personal history of malignant neoplasm of prostate: Secondary | ICD-10-CM | POA: Diagnosis not present

## 2014-09-14 DIAGNOSIS — Z7901 Long term (current) use of anticoagulants: Secondary | ICD-10-CM | POA: Insufficient documentation

## 2014-09-14 DIAGNOSIS — N400 Enlarged prostate without lower urinary tract symptoms: Secondary | ICD-10-CM | POA: Diagnosis not present

## 2014-09-14 DIAGNOSIS — I4891 Unspecified atrial fibrillation: Secondary | ICD-10-CM

## 2014-09-14 DIAGNOSIS — I341 Nonrheumatic mitral (valve) prolapse: Secondary | ICD-10-CM

## 2014-09-14 HISTORY — PX: TEE WITHOUT CARDIOVERSION: SHX5443

## 2014-09-14 SURGERY — ECHOCARDIOGRAM, TRANSESOPHAGEAL
Anesthesia: Moderate Sedation

## 2014-09-14 MED ORDER — FENTANYL CITRATE 0.05 MG/ML IJ SOLN
INTRAMUSCULAR | Status: AC
  Filled 2014-09-14: qty 2

## 2014-09-14 MED ORDER — MIDAZOLAM HCL 10 MG/2ML IJ SOLN
INTRAMUSCULAR | Status: DC | PRN
  Administered 2014-09-14 (×2): 2 mg via INTRAVENOUS
  Administered 2014-09-14: 1 mg via INTRAVENOUS

## 2014-09-14 MED ORDER — MIDAZOLAM HCL 5 MG/ML IJ SOLN
INTRAMUSCULAR | Status: AC
  Filled 2014-09-14: qty 2

## 2014-09-14 MED ORDER — BUTAMBEN-TETRACAINE-BENZOCAINE 2-2-14 % EX AERO
INHALATION_SPRAY | CUTANEOUS | Status: DC | PRN
  Administered 2014-09-14: 2 via TOPICAL

## 2014-09-14 MED ORDER — DIPHENHYDRAMINE HCL 50 MG/ML IJ SOLN
INTRAMUSCULAR | Status: AC
  Filled 2014-09-14: qty 1

## 2014-09-14 MED ORDER — FENTANYL CITRATE 0.05 MG/ML IJ SOLN
INTRAMUSCULAR | Status: DC | PRN
  Administered 2014-09-14 (×2): 25 ug via INTRAVENOUS
  Administered 2014-09-14: 12.5 ug via INTRAVENOUS

## 2014-09-14 MED ORDER — SODIUM CHLORIDE 0.9 % IV SOLN
INTRAVENOUS | Status: DC
  Administered 2014-09-14: 500 mL via INTRAVENOUS

## 2014-09-14 NOTE — CV Procedure (Signed)
    TEE  Indications: Atrial fibrillation  Findings: No LAA thrombus, normal EF, mild AI, mild MR  On for ablation tomorrow.   William Furbish, MD

## 2014-09-14 NOTE — Progress Notes (Signed)
  Echocardiogram Echocardiogram Transesophageal has been performed.  Mauricio Po 09/14/2014, 2:03 PM

## 2014-09-14 NOTE — Interval H&P Note (Signed)
History and Physical Interval Note:  09/14/2014 1:31 PM  William Combs  has presented today for surgery, with the diagnosis of a fib   The various methods of treatment have been discussed with the patient and family. After consideration of risks, benefits and other options for treatment, the patient has consented to  Procedure(s): TRANSESOPHAGEAL ECHOCARDIOGRAM (TEE) (N/A) as a surgical intervention .  The patient's history has been reviewed, patient examined, no change in status, stable for surgery.  I have reviewed the patient's chart and labs.  Questions were answered to the patient's satisfaction.     Lavar Rosenzweig

## 2014-09-14 NOTE — H&P (View-Only) (Signed)
Primary Care Physician: Henrine Screws, MD Referring Physician:  Dr Aundra Dubin Primary EP:  William Combs is a 68 y.o. male with a h/o persistent afib first dx in June of this year when hospitalized with a gastroenteritis.  He underwent TEE DCCV and d/c on eliquis and Cardizem. He then stopped his Cardizem and was taking eliquis irregularly and went back into afib/flutter and again went thru TEE DCCV. He began taking his meds on a regular basis and first of September noted his heart rate was in the upper 80's instead of low 60's. He was found to be in typical aflutter and underwent DCCV. He is now on flecanide and cardizem and is being faithful with Eliquis, maintaining NSR. Here today to discuss afib/flutter ablation.  He does drink 2-3 alcoholic drinks a night and recent sleep study showed severe sleep apnea. Appointment with Dr. Gwenette Greet 11/3.  Today, he denies symptoms of palpitations, chest pain, shortness of breath, orthopnea, PND, lower extremity edema, dizziness, presyncope, syncope, or neurologic sequela. The patient is tolerating medications without difficulties and is otherwise without complaint today.   Past Medical History  Diagnosis Date  . Hypertension   . BPH (benign prostatic hyperplasia)   . Hyperlipidemia   . Prostate cancer DX  12/16/13    gleason 3+3=6, volume 48 gm, s/p seed implant 02/2014.  Marland Kitchen Arthritis     JOINTS  . History of asthma     EXERCISE INDUCED ASTHMA  1996 FOR 6 MONTHS  . Atrial fibrillation     a. 05/25/14 in the setting of campylobacter infection and diarrhea, TEE w/ cardioversion on 7/2. on eliquis and diltiazem  . Campylobacter diarrhea     a. 05/25/14   Past Surgical History  Procedure Laterality Date  . Knee arthroscopy  09/25/2012    Procedure: ARTHROSCOPY KNEE;  Surgeon: Gearlean Alf, MD;  Location: [facility];  Service: Orthopedics;  Laterality: Left;  with medial and lateral meniscus Debridement  . Prostate biopsy  12/16/13    Gleason  6, volume 48 gm  . Right knee surgery  1975  . Radioactive seed implant N/A 03/18/2014    Procedure: RADIOACTIVE SEED IMPLANT;  Surgeon: [name], MD;  Location: [facility];  Service: Urology;  Laterality: N/A;  . Tee without cardioversion N/A 05/28/2014    Procedure: TRANSESOPHAGEAL ECHOCARDIOGRAM (TEE);  Surgeon: Lelon Perla, MD;  Location: [facility];  Service: Cardiovascular;  Laterality: N/A;  . Cardioversion N/A 05/28/2014    Procedure: CARDIOVERSION;  Surgeon: Lelon Perla, MD;  Location: [facility] ENDOSCOPY;  Service: Cardiovascular;  Laterality: N/A;  . Tee without cardioversion N/A 06/09/2014    Procedure: TRANSESOPHAGEAL ECHOCARDIOGRAM (TEE);  Surgeon: Pixie Casino, MD;  Location: [facility] ENDOSCOPY;  Service: Cardiovascular;  Laterality: N/A;  . Cardioversion N/A 06/09/2014    Procedure: CARDIOVERSION;  Surgeon: Pixie Casino, MD;  Location: [facility] ENDOSCOPY;  Service: Cardiovascular;  Laterality: N/A;  . Cardioversion N/A 07/29/2014    Procedure: CARDIOVERSION;  Surgeon: Larey Dresser, MD;  Location: [facility] ENDOSCOPY;  Service: Cardiovascular;  Laterality: N/A;    Current Outpatient Prescriptions  Medication Sig Dispense Refill  . apixaban (ELIQUIS) 5 MG TABS tablet Take 1 tablet (5 mg total) by mouth 2 (two) times daily.  60 tablet  3  . diltiazem (CARDIZEM CD) 120 MG 24 hr capsule Take 1 capsule (120 mg total) by mouth daily.  30 capsule  6  . flecainide (TAMBOCOR) 100 MG tablet Take 1 tablet (100  mg total) by mouth every 12 (twelve) hours.  60 tablet  6  . hydroxypropyl methylcellulose / hypromellose (ISOPTO TEARS / GONIOVISC) 2.5 % ophthalmic solution Place 1 drop into both eyes daily.      . Multiple Vitamin (MULTIVITAMIN WITH MINERALS) TABS Take 1 tablet by mouth every evening.       . naproxen sodium (ANAPROX) 220 MG tablet Take 440 mg by mouth daily as needed (pain).       . rosuvastatin (CRESTOR) 10 MG tablet Take 5 mg by mouth every evening.       No  current facility-administered medications for this visit.    No Known Allergies  History   Social History  . Marital Status: Married    Spouse Name: N/A    Number of Children: N/A  . Years of Education: N/A   Occupational History  . 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 Smoking status: Never Smoker   . Smokeless tobacco: Never Used  . Alcohol Use: 7.0 oz/week    14 drink(s) per week     Comment: 2-3 DRINKS DAILY (standard size)  . Drug Use: No  . Sexual Activity: Not on file   Other Topics Concern  . Not on file   Social History Narrative  . No narrative on file    Family History  Problem Relation Age of Onset  . Dementia Mother   . Parkinson's disease Father   . Heart disease Brother     Older brother had some sort of rhythm procedure, is now active afterwards  . Hypertension Father   . Heart attack Neg Hx     ROS- All systems are reviewed and negative except as per the HPI above  Physical Exam: Filed Vitals:   08/24/14 1128  BP: 130/88  Pulse: 59  Height: 5' 11.5" (1.816 m)  Weight: 193 lb 3.2 oz (87.635 kg)    GEN- The patient is well appearing, alert and oriented x 3 today.   Head- normocephalic, atraumatic Eyes-  Sclera clear, conjunctiva pink Ears- hearing intact Oropharynx- clear Neck- supple, no JVP Lymph- no cervical lymphadenopathy Lungs- Clear to ausculation bilaterally, normal work of breathing Heart- Regular rate and rhythm, no murmurs, rubs or gallops, PMI not laterally displaced GI- soft, NT, ND, + BS Extremities- no clubbing, cyanosis, or edema MS- no significant deformity or atrophy Skin- no rash or lesion Psych- euthymic mood, full affect Neuro- strength and sensation are intact  EKG- NSR at 59 bpm, QTc 473ms. Other prior Ekgs reviewed showing afib as well as typical aflutter. Epic records including Dr McLeans/ Kleins/ [facility] notes are reviewed  Assessment and Plan: 1.  Symptomatic Persistent afib/aflutter failing flecainide Therapeutic strategies for afib and atrial flutter including medicine and ablation were discussed in detail with the patient today. Risk, benefits, and alternatives to EP study and radiofrequency ablation for afib were also discussed in detail today. These risks include but are not limited to stroke, bleeding, vascular damage, tamponade, perforation, damage to the esophagus, lungs, and other structures, pulmonary vein stenosis, radiation exposure, worsening renal function, and death. The patient understands these risk and wishes to proceed.  We will therefore proceed with catheter ablation.  We will schedule the procedure at the next available time. Has been taking eliquis regularly since the end of July with Chadsvasc of 2 (age/HTN)  2. Sleep Apnea Continue with f/u of positive sleep study with  Dr. Gwenette Greet as scheduled.  3. Moderate Alcohol consumption Encouraged to minimize use for better outcome to maintain NSR.  F/U after procedure

## 2014-09-14 NOTE — Discharge Instructions (Addendum)
°  Transesophageal Echocardiogram, Care After Refer to this sheet in the next few weeks. These instructions provide you with information on caring for yourself after your procedure. Your caregiver may also give you more specific instructions. Your treatment has been planned according to current medical practices, but problems sometimes occur. Call your caregiver if you have any problems or questions after your procedure. HOME CARE INSTRUCTIONS  If you were given medicine to help you relax (sedative), do not drive, operate machinery, or sign important documents for 24 hours.  Avoid alcohol and hot or warm beverages for the first 24 hours after the procedure.  Only take over-the-counter or prescription medicines for pain, discomfort, or fever as directed by your caregiver. You may resume taking your normal medicines unless your caregiver tells you otherwise. Ask your caregiver when you may resume taking medicines that may cause bleeding, such as aspirin, clopidogrel, or warfarin.  You may return to your normal diet and activities on the day after your procedure, or as directed by your caregiver. Walking may help to reduce any bloated feeling in your abdomen.  Drink enough fluids to keep your urine clear or pale yellow.  You may gargle with salt water if you have a sore throat. SEEK IMMEDIATE MEDICAL CARE IF:  You have severe nausea or vomiting.  You have severe abdominal pain, abdominal cramps that last longer than 6 hours, or abdominal swelling (distention).  You have severe shoulder or back pain.  You have trouble swallowing.  You have shortness of breath, your breathing is shallow, or you are breathing faster than normal.  You have a fever or a rapid heartbeat.  You vomit blood or material that looks like coffee grounds.  You have bloody, black, or tarry stools. MAKE SURE YOU:  Understand these instructions.  Will watch your condition.  Will get help right away if you are not  doing well or get worse. Document Released: 06/27/2004 Document Revised: 03/30/2014 Document Reviewed: 02/13/2012 [facility] Patient Information 2015 [place], [place]. This information is not intended to replace advice given to you by your health care provider. Make sure you discuss any questions you have with your health care provider.

## 2014-09-15 ENCOUNTER — Ambulatory Visit (HOSPITAL_BASED_OUTPATIENT_CLINIC_OR_DEPARTMENT_OTHER)
Admission: RE | Admit: 2014-09-15 | Discharge: 2014-09-16 | Disposition: A | Discharge status: Home / Self Care | Payer: Medicare Other | Source: Ambulatory Visit | Attending: Internal Medicine | Admitting: Internal Medicine

## 2014-09-15 ENCOUNTER — Encounter (HOSPITAL_COMMUNITY): Payer: Self-pay | Admitting: Cardiology

## 2014-09-15 ENCOUNTER — Ambulatory Visit (HOSPITAL_COMMUNITY): Payer: Medicare Other | Admitting: Certified Registered"

## 2014-09-15 ENCOUNTER — Encounter (HOSPITAL_COMMUNITY)
Admission: RE | Disposition: A | Discharge status: Home / Self Care | Payer: Medicare Other | Source: Ambulatory Visit | Attending: Internal Medicine

## 2014-09-15 ENCOUNTER — Encounter (HOSPITAL_COMMUNITY): Payer: Medicare Other | Admitting: Certified Registered"

## 2014-09-15 DIAGNOSIS — I481 Persistent atrial fibrillation: Secondary | ICD-10-CM | POA: Diagnosis not present

## 2014-09-15 DIAGNOSIS — I483 Typical atrial flutter: Secondary | ICD-10-CM

## 2014-09-15 DIAGNOSIS — I4892 Unspecified atrial flutter: Secondary | ICD-10-CM | POA: Diagnosis present

## 2014-09-15 DIAGNOSIS — I480 Paroxysmal atrial fibrillation: Secondary | ICD-10-CM

## 2014-09-15 DIAGNOSIS — I10 Essential (primary) hypertension: Secondary | ICD-10-CM

## 2014-09-15 DIAGNOSIS — I4891 Unspecified atrial fibrillation: Secondary | ICD-10-CM | POA: Diagnosis present

## 2014-09-15 DIAGNOSIS — E785 Hyperlipidemia, unspecified: Secondary | ICD-10-CM | POA: Insufficient documentation

## 2014-09-15 DIAGNOSIS — G4730 Sleep apnea, unspecified: Secondary | ICD-10-CM | POA: Insufficient documentation

## 2014-09-15 DIAGNOSIS — N400 Enlarged prostate without lower urinary tract symptoms: Secondary | ICD-10-CM | POA: Insufficient documentation

## 2014-09-15 DIAGNOSIS — Z791 Long term (current) use of non-steroidal anti-inflammatories (NSAID): Secondary | ICD-10-CM | POA: Insufficient documentation

## 2014-09-15 DIAGNOSIS — Z7901 Long term (current) use of anticoagulants: Secondary | ICD-10-CM | POA: Insufficient documentation

## 2014-09-15 DIAGNOSIS — Z8546 Personal history of malignant neoplasm of prostate: Secondary | ICD-10-CM | POA: Insufficient documentation

## 2014-09-15 HISTORY — PX: ATRIAL FIBRILLATION ABLATION: SHX5456

## 2014-09-15 HISTORY — PX: ABLATION OF DYSRHYTHMIC FOCUS: SHX254

## 2014-09-15 LAB — POCT ACTIVATED CLOTTING TIME
ACTIVATED CLOTTING TIME: 270 s
ACTIVATED CLOTTING TIME: 298 s
ACTIVATED CLOTTING TIME: 276 s
Activated Clotting Time: 152 seconds

## 2014-09-15 SURGERY — ATRIAL FIBRILLATION ABLATION
Anesthesia: Monitor Anesthesia Care

## 2014-09-15 MED ORDER — DOBUTAMINE IN D5W 4-5 MG/ML-% IV SOLN
INTRAVENOUS | Status: DC | PRN
  Administered 2014-09-15: 20 ug/kg/min via INTRAVENOUS

## 2014-09-15 MED ORDER — FENTANYL CITRATE 0.05 MG/ML IJ SOLN
INTRAMUSCULAR | Status: DC | PRN
  Administered 2014-09-15 (×2): 25 ug via INTRAVENOUS
  Administered 2014-09-15: 50 ug via INTRAVENOUS
  Administered 2014-09-15: 25 ug via INTRAVENOUS
  Administered 2014-09-15: 50 ug via INTRAVENOUS
  Administered 2014-09-15 (×3): 25 ug via INTRAVENOUS

## 2014-09-15 MED ORDER — HEPARIN SODIUM (PORCINE) 1000 UNIT/ML IJ SOLN
INTRAMUSCULAR | Status: DC | PRN
  Administered 2014-09-15: 1000 [IU] via INTRAVENOUS
  Administered 2014-09-15: 3000 [IU] via INTRAVENOUS

## 2014-09-15 MED ORDER — HYDROCODONE-ACETAMINOPHEN 5-325 MG PO TABS: 1.0000 | ORAL_TABLET | ORAL | Status: DC | PRN

## 2014-09-15 MED ORDER — DOBUTAMINE IN D5W 4-5 MG/ML-% IV SOLN
INTRAVENOUS | Status: AC
  Filled 2014-09-15: qty 250

## 2014-09-15 MED ORDER — PROMETHAZINE HCL 25 MG/ML IJ SOLN: 6.2500 mg | INTRAMUSCULAR | Status: DC | PRN

## 2014-09-15 MED ORDER — SODIUM CHLORIDE 0.9 % IJ SOLN
3.0000 mL | INTRAMUSCULAR | Status: DC | PRN
Start: 2014-09-15 — End: 2014-09-16

## 2014-09-15 MED ORDER — PROPOFOL 10 MG/ML IV BOLUS
INTRAVENOUS | Status: DC | PRN
  Administered 2014-09-15 (×4): 30 mg via INTRAVENOUS

## 2014-09-15 MED ORDER — PROPOFOL INFUSION 10 MG/ML OPTIME
INTRAVENOUS | Status: DC | PRN
  Administered 2014-09-15: 50 ug/kg/min via INTRAVENOUS

## 2014-09-15 MED ORDER — HYPROMELLOSE (GONIOSCOPIC) 2.5 % OP SOLN
2.0000 [drp] | Freq: Two times a day (BID) | OPHTHALMIC | Status: DC
  Filled 2014-09-15: qty 15

## 2014-09-15 MED ORDER — FENTANYL CITRATE 0.05 MG/ML IJ SOLN: 25.0000 ug | INTRAMUSCULAR | Status: DC | PRN

## 2014-09-15 MED ORDER — PROTAMINE SULFATE 10 MG/ML IV SOLN
INTRAVENOUS | Status: DC | PRN
  Administered 2014-09-15 (×3): 10 mg via INTRAVENOUS

## 2014-09-15 MED ORDER — OXYCODONE HCL 5 MG PO TABS: 5.0000 mg | ORAL_TABLET | Freq: Once | ORAL | Status: AC | PRN

## 2014-09-15 MED ORDER — HEPARIN SODIUM (PORCINE) 1000 UNIT/ML IJ SOLN
INTRAMUSCULAR | Status: AC
  Filled 2014-09-15: qty 1

## 2014-09-15 MED ORDER — APIXABAN 5 MG PO TABS
5.0000 mg | ORAL_TABLET | Freq: Two times a day (BID) | ORAL | Status: DC
  Filled 2014-09-15 (×3): qty 1

## 2014-09-15 MED ORDER — OXYCODONE HCL 5 MG/5ML PO SOLN: 5.0000 mg | Freq: Once | ORAL | Status: AC | PRN

## 2014-09-15 MED ORDER — ACETAMINOPHEN 325 MG PO TABS: 650.0000 mg | ORAL_TABLET | ORAL | Status: DC | PRN

## 2014-09-15 MED ORDER — LACTATED RINGERS IV SOLN
INTRAVENOUS | Status: DC | PRN
  Administered 2014-09-15 (×2): via INTRAVENOUS

## 2014-09-15 MED ORDER — ONDANSETRON HCL 4 MG/2ML IJ SOLN
INTRAMUSCULAR | Status: DC | PRN
  Administered 2014-09-15: 4 mg via INTRAVENOUS

## 2014-09-15 MED ORDER — SODIUM CHLORIDE 0.9 % IJ SOLN: 3.0000 mL | Freq: Two times a day (BID) | INTRAMUSCULAR | Status: DC

## 2014-09-15 MED ORDER — BUPIVACAINE HCL (PF) 0.25 % IJ SOLN
INTRAMUSCULAR | Status: AC
  Filled 2014-09-15: qty 30

## 2014-09-15 MED ORDER — SODIUM CHLORIDE 0.9 % IV SOLN: 250.0000 mL | INTRAVENOUS | Status: DC | PRN

## 2014-09-15 MED ORDER — ONDANSETRON HCL 4 MG/2ML IJ SOLN: 4.0000 mg | Freq: Four times a day (QID) | INTRAMUSCULAR | Status: DC | PRN

## 2014-09-15 MED ORDER — MIDAZOLAM HCL 5 MG/5ML IJ SOLN
INTRAMUSCULAR | Status: DC | PRN
  Administered 2014-09-15: 2 mg via INTRAVENOUS

## 2014-09-15 NOTE — Progress Notes (Signed)
IV saline locked. Waiting on TCU bed assignment.

## 2014-09-15 NOTE — Anesthesia Preprocedure Evaluation (Addendum)
Anesthesia Evaluation  Patient identified by MRN, date of birth, ID band Patient awake    Reviewed: Allergy & Precautions, H&P , NPO status , Patient's Chart, lab work & pertinent test results  History of Anesthesia Complications Negative for: history of anesthetic complications  Airway Mallampati: II TM Distance: >3 FB Neck ROM: Full    Dental  (+) Teeth Intact, Dental Advisory Given   Pulmonary neg pulmonary ROS,  breath sounds clear to auscultation        Cardiovascular hypertension, Pt. on medications + dysrhythmias Atrial Fibrillation Rhythm:Irregular Rate:Normal     Neuro/Psych negative neurological ROS  negative psych ROS   GI/Hepatic negative GI ROS, Neg liver ROS,   Endo/Other  negative endocrine ROS  Renal/GU negative Renal ROS     Musculoskeletal negative musculoskeletal ROS (+)   Abdominal   Peds  Hematology negative hematology ROS (+)   Anesthesia Other Findings   Reproductive/Obstetrics                          Anesthesia Physical  Anesthesia Plan  ASA: III  Anesthesia Plan: MAC   Post-op Pain Management:    Induction: Intravenous  Airway Management Planned: Natural Airway and Mask  Additional Equipment:   Intra-op Plan:   Post-operative Plan:   Informed Consent: I have reviewed the patients History and Physical, chart, labs and discussed the procedure including the risks, benefits and alternatives for the proposed anesthesia with the patient or authorized representative who has indicated his/her understanding and acceptance.   Dental advisory given  Plan Discussed with: CRNA, Anesthesiologist and Surgeon  Anesthesia Plan Comments:         Anesthesia Quick Evaluation

## 2014-09-15 NOTE — Anesthesia Postprocedure Evaluation (Signed)
  Anesthesia Post-op Note  Patient: William Combs  Procedure(s) Performed: Procedure(s): ATRIAL FIBRILLATION ABLATION (N/A)  Patient Location: Cath Lab  Anesthesia Type:MAC  Level of Consciousness: awake, alert  and oriented  Airway and Oxygen Therapy: Patient Spontanous Breathing  Post-op Pain: none  Post-op Assessment: Post-op Vital signs reviewed, Patient's Cardiovascular Status Stable, Respiratory Function Stable, Patent Airway and No signs of Nausea or vomiting  Post-op Vital Signs: Reviewed and stable  Last Vitals:  Filed Vitals:   09/15/14 0548  BP: 134/72  Pulse: 50  Temp: 36.8 C  Resp: 18    Complications: No apparent anesthesia complications

## 2014-09-15 NOTE — Interval H&P Note (Signed)
History and Physical Interval Note:  09/15/2014 7:45 AM  William Combs  has presented today for surgery, with the diagnosis of afib  The various methods of treatment have been discussed with the patient and family. After consideration of risks, benefits and other options for treatment, the patient has consented to  Procedure(s): ATRIAL FIBRILLATION ABLATION (N/A) as a surgical intervention .  The patient's history has been reviewed, patient examined, no change in status, stable for surgery.  I have reviewed the patient's chart and labs.  Questions were answered to the patient's satisfaction.     Thompson Grayer

## 2014-09-15 NOTE — Op Note (Signed)
SURGEON:  Thompson Grayer, MD  PREPROCEDURE DIAGNOSES: 1. Paroxysmal atrial fibrillation. 2. Typical appearing atrial flutter  POSTPROCEDURE DIAGNOSES: 1. Paroxysmal  atrial fibrillation. 2. Typical appearing atrial flutter  PROCEDURES: 1. Comprehensive electrophysiologic study. 2. Coronary sinus pacing and recording. 3. Three-dimensional mapping of atrial fibrillation with additional mapping and ablation of a second discrete focus (atrial flutter) 4. Ablation of atrial fibrillation with additional mapping and ablation of a second discrete focus (atrial flutter) 5. Intracardiac echocardiography. 6. Transseptal puncture of an intact septum. 7. Rotational Angiography with processing at an independent workstation 8. Arrhythmia induction with pacing with dobutamine infusion  INTRODUCTION:  William Combs is a 68 y.o. male with a history of paroxysmal atrial fibrillation and typical appearing atrial flutter who now presents for EP study and radiofrequency ablation.  The patient reports initially being diagnosed with atrial fibrillation after presenting with symptomatic palpitations and fatgiue. The patient reports increasing frequency and duration of atrial arrhythmias since that time.  The patient has failed medical therapy with flecainide.  The patient therefore presents today for catheter ablation of atrial fibrillation and atrial flutter.  DESCRIPTION OF PROCEDURE:  Informed written consent was obtained, and the patient was brought to the electrophysiology lab in a fasting state.  The patient was adequately sedated with intravenous medications as outlined in the anesthesia report.  The patient's left and right groins were prepped and draped in the usual sterile fashion by the EP lab staff.  Using a percutaneous Seldinger technique, two 7-French and one 11-French hemostasis sheaths were placed into the right common femoral vein.    3 Dimensional Rotational Angiography: A 5 french pigtail  catheter was introduced through the right common femoral vein and advanced into the inferior venocava.  3 demential rotational angiography was then performed by power injection of 100cc of nonionic contrast.  Reprocessing at an independent work station was then performed.   This demonstrated a moderate sized left atrium with 4 separate pulmonary veins which were also moderate in size.  There were no anomalous veins or significant abnormalities.  A 3 dimensional rendering of the left atrium was then merged using [employer] onto the [employer] system and registered with intracardiac echo (see below).  The pigtail catheter was then removed.  Catheter Placement:  A 7-French Biosense Webster Decapolar coronary sinus catheter was introduced through the right common femoral vein and advanced into the coronary sinus for recording and pacing from this location.  A quadrapolar catheter was introduced through the right common femoral vein and advanced into the right ventricle for recording and pacing.  This catheter was then pulled back to the His bundle location.    Initial Measurements: The patient presented to the electrophysiology lab in sinus rhythm.  The patients PR interval measured 200 msec with a QRS duration of 122 msec and a QT interval of 952 msec.  The AH interval measured 129 msec and the HV interval measured 50 msec.     Intracardiac Echocardiography: A 10-French Biosense Webster AcuNav intracardiac echocardiography catheter was introduced through the left common femoral vein and advanced into the right atrium. Intracardiac echocardiography was performed of the left atrium, and a three-dimensional anatomical rendering of the left atrium was performed using CARTO sound technology.  The patient was noted to have a moderate sized left atrium.  The interatrial septum was prominent but not aneurysmal. All 4 pulmonary veins were visualized and noted to have separate ostia.  The pulmonary veins  were moderate in size.  The left  atrial appendage was visualized and did not reveal thrombus.   There was no evidence of pulmonary vein stenosis.   Transseptal Puncture: The middle right common femoral vein sheath was exchanged for an 8.5 [place] SL2 transseptal sheath and transseptal access was achieved in a standard fashion using a Brockenbrough needle under biplane fluoroscopy with intracardiac echocardiography confirmation of the transseptal puncture.  Once transseptal access had been achieved, heparin was administered intravenously and intra- arterially in order to maintain an ACT of greater than 300 seconds throughout the procedure.   3D Mapping and Ablation: The His bundle catheter was removed and in its place a 3.5 mm Biosense TransMontaigne ablation catheter was advanced into the right atrium.  The transseptal sheath was pulled back into the IVC over a guidewire.  The ablation catheter was advanced across the transseptal hole using the wire as a guide.  The transseptal sheath was then re-advanced over the guidewire into the left atrium.  A duodecapolar Biosense Webster circular mapping catheter was introduced through the transseptal sheath and positioned over the mouth of all 4 pulmonary veins.  Three-dimensional electroanatomical mapping was performed using CARTO technology.  This demonstrated electrical activity within all four pulmonary veins at baseline. The patient underwent successful sequential electrical isolation and anatomical encircling of all four pulmonary veins using radiofrequency current with a circular mapping catheter as a guide.   The ablation catheter was then pulled back into the right atrial and positioned along the cavo-tricuspid isthmus.  Mapping along the atrial side of the isthmus was performed.  This demonstrated a standard isthmus.  A series of radiofrequency applications were then delivered along the isthmus.  Complete bidirectional cavotricuspid isthmus  block was achieved as confirmed by differential atrial pacing from the low lateral right atrium.  A stimulus to earliest atrial activation across the isthmus measured 190 msec bi-directionally.  The patient was observe without return of conduction through the isthmus.  Measurements Following Ablation: Following ablation, dobutamine was infused up to 20 mcg/min with no inducible atrial fibrillation, atrial tachycardia, atrial flutter, or sustained PACs. In sinus rhythm with RR interval was 923 msec, with PR 212 msec, QRS 99 msec, and Qt 433 msec.  Following ablation the AH interval measured 90 msec with an HV interval of 55 msec. Ventricular pacing was performed, which revealed VA dissociation when pacing at 600 msec.  Rapid atrial pacing was performed, which revealed an AV Wenckebach cycle length of 470 msec.  Electroisolation was then again confirmed in all four pulmonary veins.  Intracardiac echocardiography was again performed, which revealed no pericardial effusion.  The procedure was therefore considered completed.  All catheters were removed, and the sheaths were aspirated and flushed.  The patient was transferred to the recovery area for sheath removal per protocol.  A limited bedside transthoracic echocardiogram revealed no pericardial effusion. EBL<77ml.  There were no early apparent complications.  CONCLUSIONS: 1. Sinus rhythm upon presentation.   2. Rotational Angiography reveals a moderate sized left atrium with four separate pulmonary veins without evidence of pulmonary vein stenosis. 3. Successful electrical isolation and anatomical encircling of all four pulmonary veins with radiofrequency current.    4. Cavo-tricuspid isthmus ablation was performed with complete bidirectional isthmus block achieved.  5. No inducible arrhythmias following ablation both on and off of dobutamine  6. No early apparent complications.   William Gunnerson,MD 11:07 AM 09/15/2014

## 2014-09-15 NOTE — Transfer of Care (Signed)
Immediate Anesthesia Transfer of Care Note  Patient: William Combs  Procedure(s) Performed: Procedure(s): ATRIAL FIBRILLATION ABLATION (N/A)  Patient Location: Cath Lab  Anesthesia Type:MAC  Level of Consciousness: awake, alert  and oriented  Airway & Oxygen Therapy: Patient Spontanous Breathing  Post-op Assessment: Report given to PACU RN and Post -op Vital signs reviewed and stable  Post vital signs: Reviewed and stable  Complications: No apparent anesthesia complications

## 2014-09-15 NOTE — Discharge Summary (Signed)
ELECTROPHYSIOLOGY PROCEDURE DISCHARGE SUMMARY    Patient ID: William Combs,  MRN: [id number], DOB/AGE: [birth year] 68 y.o.  Admit date: 09/15/2014 Discharge date: 09/16/2014  Primary Care Physician: Henrine Screws, MD Primary Cardiologist: Aundra Dubin Electrophysiologist: Thompson Grayer, MD  Primary Discharge Diagnosis:  Persistent atrial fibrillation and atrial flutter status post ablation this admission  Secondary Discharge Diagnosis:  1.  Hypertension 2.  BPH 3.  Prostate cancer 4.  Asthma  Procedures This Admission:  1.  Electrophysiology study and radiofrequency catheter ablation on 09-15-14 by Dr Thompson Grayer.  This study demonstrated sinus rhythm upon presentation; rotational Angiography reveals a moderate sized left atrium with four separate pulmonary veins without evidence of pulmonary vein stenosis; successful electrical isolation and anatomical encircling of all four pulmonary veins with radiofrequency current; cavo-tricuspid isthmus ablation was performed with complete bidirectional isthmus block achieved; no inducible arrhythmias following ablation both on and off of dobutamine. There were no early apparent complications.   Brief HPI: William Combs is a 68 y.o. male with a history of persistent atrial fibrillation.  They have failed medical therapy with Flecainide. Risks, benefits, and alternatives to catheter ablation of atrial fibrillation were reviewed with the patient who wished to proceed.  The patient underwent TEE prior to the procedure which demonstrated normal LV function and no LAA thrombus.    Hospital Course:  The patient was admitted and underwent EPS/RFCA of atrial fibrillation with details as outlined above.  They were monitored on telemetry overnight which demonstrated sinus rhythm.  Groin was without complication on the day of discharge.  The patient was examined by Dr Rayann Heman and considered to be stable for discharge.  Wound care and restrictions  were reviewed with the patient.  The patient will be seen back by Dr Rayann Heman in 12 weeks for post ablation follow up.   Discharge Vitals: Blood pressure 122/74, pulse 63, temperature 98.4 F (36.9 C), temperature source Oral, resp. rate 20, height 5' 11.5" (1.816 m), weight 187 lb 6.3 oz (85 kg), SpO2 95.00%.  Physical Exam: Filed Vitals:   09/15/14 1958 09/15/14 2000 09/16/14 0025 09/16/14 0530  BP: 125/62 108/63 93/42 122/74  Pulse: 57 57 59 63  Temp: 98.5 F (36.9 C)  98.5 F (36.9 C) 98.4 F (36.9 C)  TempSrc: Oral  Oral Oral  Resp: 18  18 20   Height:      Weight:   187 lb 6.3 oz (85 kg)   SpO2: 98% 98% 96% 95%    GEN- The patient is well appearing, alert and oriented x 3 today.   Head- normocephalic, atraumatic Eyes-  Sclera clear, conjunctiva pink Ears- hearing intact Oropharynx- clear Neck- supple, Lungs- Clear to ausculation bilaterally, normal work of breathing Heart- Regular rate and rhythm, no murmurs, rubs or gallops, PMI not laterally displaced GI- soft, NT, ND, + BS Extremities- no clubbing, cyanosis, or edema, groin is without hematoma/ bruit MS- no significant deformity or atrophy Skin- no rash or lesion Psych- euthymic mood, full affect Neuro- strength and sensation are intact   Labs:   Lab Results  Component Value Date   WBC 5.4 09/08/2014   HGB 14.1 09/08/2014   HCT 42.8 09/08/2014   MCV 96.8 09/08/2014   PLT 178.0 09/08/2014     Recent Labs Lab 09/16/14 0242  NA 140  K 4.2  CL 105  CO2 24  BUN 13  CREATININE 0.96  CALCIUM 8.6  GLUCOSE 103*      Discharge Medications:    Medication List  STOP taking these medications       flecainide 100 MG tablet  Commonly known as:  TAMBOCOR      TAKE these medications       apixaban 5 MG Tabs tablet  Commonly known as:  ELIQUIS  Take 1 tablet (5 mg total) by mouth 2 (two) times daily.     diltiazem 120 MG 24 hr capsule  Commonly known as:  CARDIZEM CD  Take 1 capsule (120 mg total)  by mouth daily.     hydroxypropyl methylcellulose / hypromellose 2.5 % ophthalmic solution  Commonly known as:  ISOPTO TEARS / GONIOVISC  Place 2 drops into both eyes 2 (two) times daily.     multivitamin with minerals Tabs tablet  Take 1 tablet by mouth every evening.     naproxen sodium 220 MG tablet  Commonly known as:  ANAPROX  Take 440 mg by mouth 2 (two) times daily as needed (for pain).     pantoprazole 40 MG tablet  Commonly known as:  PROTONIX  Take 1 tablet (40 mg total) by mouth daily.     rosuvastatin 10 MG tablet  Commonly known as:  CRESTOR  Take 5 mg by mouth every evening.        Disposition:   Follow-up Information   Follow up with Thompson Grayer, MD On 12/16/2014. (9:30 am)    Specialty:  Cardiology   Contact information:   [address] [place] [address] [phone]       Duration of Discharge Encounter: Greater than 30 minutes including physician time.  Signed,  Thompson Grayer MD

## 2014-09-15 NOTE — Progress Notes (Signed)
Site area: RFV x3 Site Prior to Removal:  Level  0 Pressure Applied For: 15 min Manual:    Patient Status During Pull:  stable Post Pull Site:  Level 0 Post Pull Instructions Given:  done Post Pull Pulses Present: palpable Dressing Applied: clear  Bedrest begins @ 1225 pm Comments: no complications

## 2014-09-15 NOTE — H&P (View-Only) (Signed)
Primary Care Physician: Henrine Screws, MD Referring Physician:  Dr Aundra Dubin Primary EP:  William Combs is a 68 y.o. male with a h/o persistent afib first dx in June of this year when hospitalized with a gastroenteritis.  He underwent TEE DCCV and d/c on eliquis and Cardizem. He then stopped his Cardizem and was taking eliquis irregularly and went back into afib/flutter and again went thru TEE DCCV. He began taking his meds on a regular basis and first of September noted his heart rate was in the upper 80's instead of low 60's. He was found to be in typical aflutter and underwent DCCV. He is now on flecanide and cardizem and is being faithful with Eliquis, maintaining NSR. Here today to discuss afib/flutter ablation.  He does drink 2-3 alcoholic drinks a night and recent sleep study showed severe sleep apnea. Appointment with Dr. Gwenette Greet 11/3.  Today, he denies symptoms of palpitations, chest pain, shortness of breath, orthopnea, PND, lower extremity edema, dizziness, presyncope, syncope, or neurologic sequela. The patient is tolerating medications without difficulties and is otherwise without complaint today.   Past Medical History  Diagnosis Date  . Hypertension   . BPH (benign prostatic hyperplasia)   . Hyperlipidemia   . Prostate cancer DX  12/16/13    gleason 3+3=6, volume 48 gm, s/p seed implant 02/2014.  Marland Kitchen Arthritis     JOINTS  . History of asthma     EXERCISE INDUCED ASTHMA  1996 FOR 6 MONTHS  . Atrial fibrillation     a. 05/25/14 in the setting of campylobacter infection and diarrhea, TEE w/ cardioversion on 7/2. on eliquis and diltiazem  . Campylobacter diarrhea     a. 05/25/14   Past Surgical History  Procedure Laterality Date  . Knee arthroscopy  09/25/2012    Procedure: ARTHROSCOPY KNEE;  Surgeon: Gearlean Alf, MD;  Location: [facility];  Service: Orthopedics;  Laterality: Left;  with medial and lateral meniscus Debridement  . Prostate biopsy  12/16/13    Gleason  6, volume 48 gm  . Right knee surgery  1975  . Radioactive seed implant N/A 03/18/2014    Procedure: RADIOACTIVE SEED IMPLANT;  Surgeon: [name], MD;  Location: [facility];  Service: Urology;  Laterality: N/A;  . Tee without cardioversion N/A 05/28/2014    Procedure: TRANSESOPHAGEAL ECHOCARDIOGRAM (TEE);  Surgeon: Lelon Perla, MD;  Location: [facility];  Service: Cardiovascular;  Laterality: N/A;  . Cardioversion N/A 05/28/2014    Procedure: CARDIOVERSION;  Surgeon: Lelon Perla, MD;  Location: [facility] ENDOSCOPY;  Service: Cardiovascular;  Laterality: N/A;  . Tee without cardioversion N/A 06/09/2014    Procedure: TRANSESOPHAGEAL ECHOCARDIOGRAM (TEE);  Surgeon: Pixie Casino, MD;  Location: [facility] ENDOSCOPY;  Service: Cardiovascular;  Laterality: N/A;  . Cardioversion N/A 06/09/2014    Procedure: CARDIOVERSION;  Surgeon: Pixie Casino, MD;  Location: [facility] ENDOSCOPY;  Service: Cardiovascular;  Laterality: N/A;  . Cardioversion N/A 07/29/2014    Procedure: CARDIOVERSION;  Surgeon: Larey Dresser, MD;  Location: [facility] ENDOSCOPY;  Service: Cardiovascular;  Laterality: N/A;    Current Outpatient Prescriptions  Medication Sig Dispense Refill  . apixaban (ELIQUIS) 5 MG TABS tablet Take 1 tablet (5 mg total) by mouth 2 (two) times daily.  60 tablet  3  . diltiazem (CARDIZEM CD) 120 MG 24 hr capsule Take 1 capsule (120 mg total) by mouth daily.  30 capsule  6  . flecainide (TAMBOCOR) 100 MG tablet Take 1 tablet (100  mg total) by mouth every 12 (twelve) hours.  60 tablet  6  . hydroxypropyl methylcellulose / hypromellose (ISOPTO TEARS / GONIOVISC) 2.5 % ophthalmic solution Place 1 drop into both eyes daily.      . Multiple Vitamin (MULTIVITAMIN WITH MINERALS) TABS Take 1 tablet by mouth every evening.       . naproxen sodium (ANAPROX) 220 MG tablet Take 440 mg by mouth daily as needed (pain).       . rosuvastatin (CRESTOR) 10 MG tablet Take 5 mg by mouth every evening.       No  current facility-administered medications for this visit.    No Known Allergies  History   Social History  . Marital Status: Married    Spouse Name: N/A    Number of Children: N/A  . Years of Education: N/A   Occupational History  . 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 Smoking status: Never Smoker   . Smokeless tobacco: Never Used  . Alcohol Use: 7.0 oz/week    14 drink(s) per week     Comment: 2-3 DRINKS DAILY (standard size)  . Drug Use: No  . Sexual Activity: Not on file   Other Topics Concern  . Not on file   Social History Narrative  . No narrative on file    Family History  Problem Relation Age of Onset  . Dementia Mother   . Parkinson's disease Father   . Heart disease Brother     Older brother had some sort of rhythm procedure, is now active afterwards  . Hypertension Father   . Heart attack Neg Hx     ROS- All systems are reviewed and negative except as per the HPI above  Physical Exam: Filed Vitals:   08/24/14 1128  BP: 130/88  Pulse: 59  Height: 5' 11.5" (1.816 m)  Weight: 193 lb 3.2 oz (87.635 kg)    GEN- The patient is well appearing, alert and oriented x 3 today.   Head- normocephalic, atraumatic Eyes-  Sclera clear, conjunctiva pink Ears- hearing intact Oropharynx- clear Neck- supple, no JVP Lymph- no cervical lymphadenopathy Lungs- Clear to ausculation bilaterally, normal work of breathing Heart- Regular rate and rhythm, no murmurs, rubs or gallops, PMI not laterally displaced GI- soft, NT, ND, + BS Extremities- no clubbing, cyanosis, or edema MS- no significant deformity or atrophy Skin- no rash or lesion Psych- euthymic mood, full affect Neuro- strength and sensation are intact  EKG- NSR at 59 bpm, QTc 444ms. Other prior Ekgs reviewed showing afib as well as typical aflutter. Epic records including Dr McLeans/ Kleins/ [place] notes are reviewed  Assessment and Plan: 1.  Symptomatic Persistent afib/aflutter failing flecainide Therapeutic strategies for afib and atrial flutter including medicine and ablation were discussed in detail with the patient today. Risk, benefits, and alternatives to EP study and radiofrequency ablation for afib were also discussed in detail today. These risks include but are not limited to stroke, bleeding, vascular damage, tamponade, perforation, damage to the esophagus, lungs, and other structures, pulmonary vein stenosis, radiation exposure, worsening renal function, and death. The patient understands these risk and wishes to proceed.  We will therefore proceed with catheter ablation.  We will schedule the procedure at the next available time. Has been taking eliquis regularly since the end of July with Chadsvasc of 2 (age/HTN)  2. Sleep Apnea Continue with f/u of positive sleep study with  Dr. Gwenette Greet as scheduled.  3. Moderate Alcohol consumption Encouraged to minimize use for better outcome to maintain NSR.  F/U after procedure

## 2014-09-15 NOTE — Progress Notes (Signed)
I did not give scheduled dose of Eliquis on MAR because when I took it to him he said his wife already gave him his medicine. I instructed him not to take any home meds so they we know what he is taking.I notified on call MD that patient took home meds. The primary doctor will need to clarify to him what meds to continue to take such as Cardizem and flecainide. Patient promised not to take any more home meds while here and he said the meds went home with wife.

## 2014-09-16 DIAGNOSIS — Z7901 Long term (current) use of anticoagulants: Secondary | ICD-10-CM | POA: Diagnosis not present

## 2014-09-16 DIAGNOSIS — I10 Essential (primary) hypertension: Secondary | ICD-10-CM | POA: Diagnosis not present

## 2014-09-16 DIAGNOSIS — I480 Paroxysmal atrial fibrillation: Secondary | ICD-10-CM

## 2014-09-16 DIAGNOSIS — I481 Persistent atrial fibrillation: Secondary | ICD-10-CM | POA: Diagnosis not present

## 2014-09-16 DIAGNOSIS — E785 Hyperlipidemia, unspecified: Secondary | ICD-10-CM | POA: Diagnosis not present

## 2014-09-16 DIAGNOSIS — I483 Typical atrial flutter: Secondary | ICD-10-CM

## 2014-09-16 LAB — BASIC METABOLIC PANEL
Anion gap: 11 (ref 5–15)
BUN: 13 mg/dL (ref 6–23)
CHLORIDE: 105 meq/L (ref 96–112)
CO2: 24 mEq/L (ref 19–32)
Calcium: 8.6 mg/dL (ref 8.4–10.5)
Creatinine, Ser: 0.96 mg/dL (ref 0.50–1.35)
GFR calc non Af Amer: 83 mL/min — ABNORMAL LOW (ref >90)
Glucose, Bld: 103 mg/dL — ABNORMAL HIGH (ref 70–99)
POTASSIUM: 4.2 meq/L (ref 3.7–5.3)
Sodium: 140 mEq/L (ref 137–147)

## 2014-09-16 MED ORDER — INFLUENZA VAC SPLIT QUAD 0.5 ML IM SUSY
0.5000 mL | PREFILLED_SYRINGE | Freq: Once | INTRAMUSCULAR | Status: AC
  Administered 2014-09-16: 09:00:00 0.5 mL via INTRAMUSCULAR
  Filled 2014-09-16: qty 0.5

## 2014-09-16 MED ORDER — PANTOPRAZOLE SODIUM 40 MG PO TBEC: 40.0000 mg | DELAYED_RELEASE_TABLET | Freq: Every day | ORAL | Status: DC

## 2014-09-16 NOTE — Care Management Note (Addendum)
  Page 1 of 1   09/16/2014     12:33:57 PM CARE MANAGEMENT NOTE 09/16/2014  Patient:  William Combs, William Combs   Account Number:  [id number]  Date Initiated:  09/16/2014  Documentation initiated by:  Mariann Laster  Subjective/Objective Assessment:   afib     Action/Plan:   CM to follow for disposition needs   Anticipated DC Date:  09/16/2014   Anticipated DC Plan:  HOME/SELF CARE         Choice offered to / List presented to:             Status of service:  Completed, signed off Medicare Important Message given?   (If response is "NO", the following Medicare IM given date fields will be blank) Date Medicare IM given:   Medicare IM given by:   Date Additional Medicare IM given:   Additional Medicare IM given by:    Discharge Disposition:  HOME/SELF CARE  Per UR Regulation:    If discussed at Long Length of Stay Meetings, dates discussed:    Comments:  William Farney RN, BSN, MSHL, CCM  Nurse - Case Manager,  (Unit [id number]  09/16/2014 Social:  From home with wife Tamon, [place] [phone]   [phone] Med Review:   Eliquis - (Patient active on Eliquis prior to this admission) Dispo Plan:  Home / Self care.

## 2014-09-16 NOTE — Discharge Instructions (Signed)
No driving for 5 days. No lifting over 5 lbs for 1 week. No sexual activity for 1 week. You may return to work in 1 week. Keep procedure site clean & dry. If you notice increased pain, swelling, bleeding or pus, call/return!  You may shower, but no soaking baths/hot tubs/pools for 1 week.  ° ° °

## 2014-09-29 ENCOUNTER — Institutional Professional Consult (permissible substitution): Payer: Medicare Other | Admitting: Pulmonary Disease

## 2014-10-02 ENCOUNTER — Ambulatory Visit: Payer: Medicare Other | Admitting: Cardiology

## 2014-10-09 IMAGING — CR DG CHEST 2V
2 series · 2 of 2 positions shown · non-contrast
Comparison: September 23, 2012

CLINICAL DATA: Hypertension.  Asthma.

EXAM:
CHEST  2 VIEW

[w chest pa]
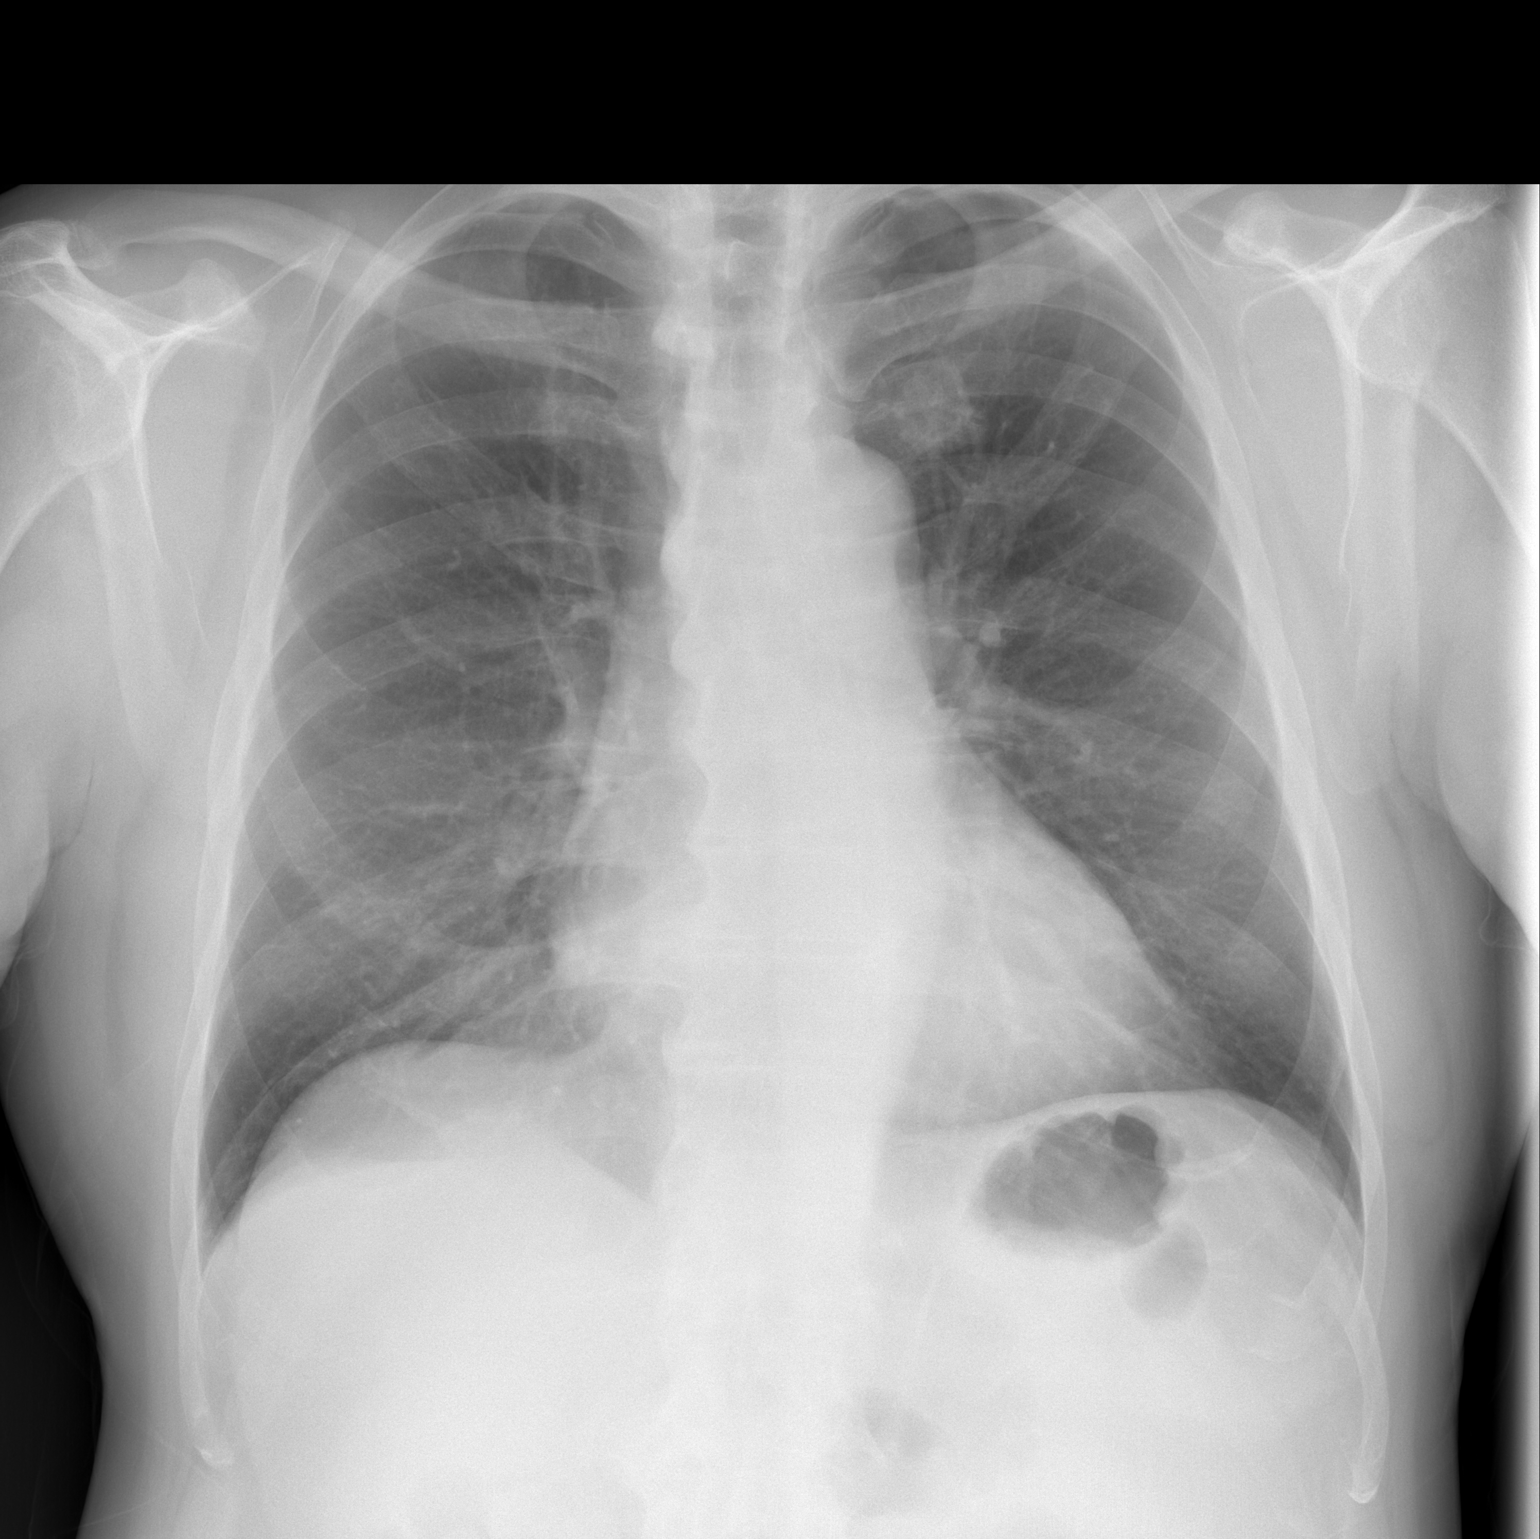

[w chest lat]
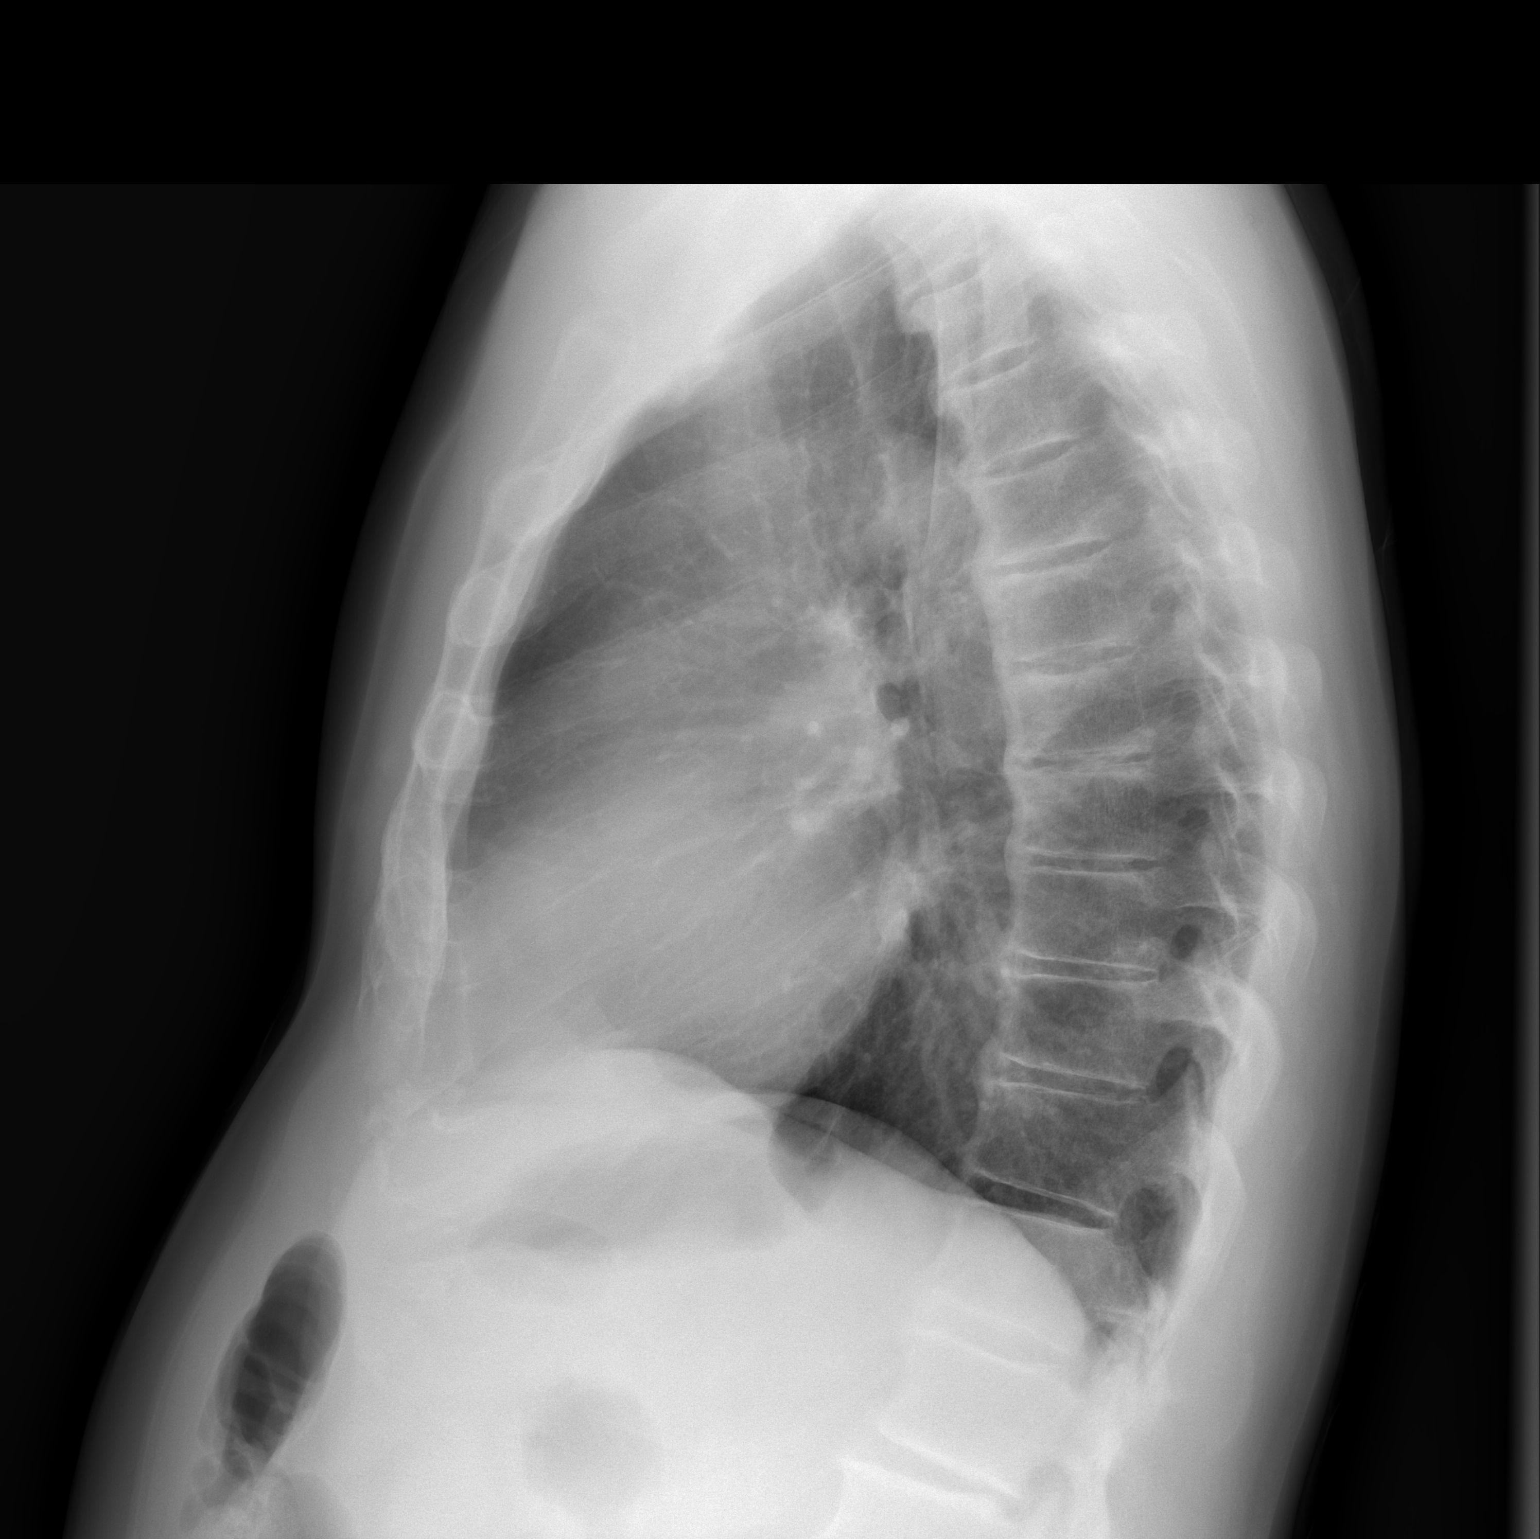

[2 of 2 positions shown; findings below may reference images not displayed]

FINDINGS: The heart size and mediastinal contours are stable. Both lungs are
clear. The visualized skeletal structures are stable. Degenerative
joint changes of the spine are noted.
IMPRESSION: No active cardiopulmonary disease.

## 2014-11-04 ENCOUNTER — Encounter: Payer: Self-pay | Admitting: Pulmonary Disease

## 2014-11-04 ENCOUNTER — Other Ambulatory Visit: Payer: Self-pay | Admitting: Pulmonary Disease

## 2014-11-04 ENCOUNTER — Ambulatory Visit (INDEPENDENT_AMBULATORY_CARE_PROVIDER_SITE_OTHER): Payer: Medicare Other | Admitting: Pulmonary Disease

## 2014-11-04 VITALS — BP 130/90 | HR 54 | Temp 98.0°F | Ht 71.0 in | Wt 195.4 lb

## 2014-11-04 DIAGNOSIS — G478 Other sleep disorders: Secondary | ICD-10-CM

## 2014-11-04 DIAGNOSIS — G4730 Sleep apnea, unspecified: Secondary | ICD-10-CM

## 2014-11-04 NOTE — Assessment & Plan Note (Signed)
The patient has severe obstructive sleep apnea by his recent home sleep test over 3 nights. He has been noted to have loud snoring, but he feels that he has restorative sleep in the mornings. He does have some sleepiness with inactivity in the afternoon, and also has difficulty staying awake for movies or sporting events in the evening. He really does not have the body habitus that is typical for severe sleep apnea, but I have explained there are always outliers. I have discussed the pathophysiology of sleep apnea with him, including its impact to his quality of life and cardiovascular health. Given the severity of his sleep apnea, along with his underlying atrial arrhythmia, I have recommended that he try C Pap the patient is agreeable to this approach. I will set the patient up on cpap at a moderate pressure level to allow for desensitization, and will troubleshoot the device over the next 4-6weeks if needed.  The pt is to call me if having issues with tolerance.  Will then optimize the pressure once patient is able to wear cpap on a consistent basis.

## 2014-11-04 NOTE — Patient Instructions (Signed)
Will start you on cpap at a moderate pressure level.  Please call if having issues with tolerance followup with me again in 8 weeks.

## 2014-11-04 NOTE — Progress Notes (Signed)
Subjective:    Patient ID: William Combs, male    DOB: [birth year], 69 y.o.   MRN: [id number]  HPI The patient is a 68 year old male who I've been asked to see for management of obstructive sleep apnea. He has recently had home sleep testing on 3 consecutive nights, and was found to have an AHI that very between 50 and 57 events per hour. He has been noted to have loud snoring, but no one has ever commented on an abnormal breathing pattern during sleep. He typically awakens 1-2 times a night, and feels fairly rested in the mornings upon arising. He denies any sleepiness issues in the mornings with inactivity, but will always take an afternoon nap. He does have issues with dozing in the evenings while trying to watch television, movies, or sporting events. He denies any sleepiness with driving. The patient states that his weight has been neutral over the last 2 years, and his Epworth score today is only 8.    Sleep Questionnaire What time do you typically go to bed?( Between what hours) 10-11pm 10-11pm at 1533 on 11/04/14 by Lilli Few, CMA How long does it take you to fall asleep? 5 minutes 5 minutes at 1533 on 11/04/14 by Lilli Few, CMA How many times during the night do you wake up? 1 1 at 1533 on 11/04/14 by Lilli Few, CMA What time do you get out of bed to start your day? 0700 0700 at 1533 on 11/04/14 by Lilli Few, CMA Do you drive or operate heavy machinery in your occupation? No No at 1533 on 11/04/14 by Lilli Few, CMA How much has your weight changed (up or down) over the past two years? (In pounds) 5 lb (2.268 kg) 5 lb (2.268 kg) at 1533 on 11/04/14 by Lilli Few, CMA Have you ever had a sleep study before? Yes Yes at 1533 on 11/04/14 by Lilli Few, CMA If yes, location of study? home sleep study home sleep study at 1533 on 11/04/14 by Lilli Few, CMA If yes, date of study? 07-16-14 07-16-14 at  1533 on 11/04/14 by Lilli Few, CMA Do you currently use CPAP? No No at 1533 on 11/04/14 by Lilli Few, CMA Do you wear oxygen at any time? No No at 1533 on 11/04/14 by Lilli Few, CMA   Review of Systems  Constitutional: Negative for fever and unexpected weight change.  HENT: Negative for congestion, dental problem, ear pain, nosebleeds, postnasal drip, rhinorrhea, sinus pressure, sneezing, sore throat and trouble swallowing.   Eyes: Negative for redness and itching.  Respiratory: Negative for cough, chest tightness, shortness of breath and wheezing.   Cardiovascular: Negative for palpitations and leg swelling.  Gastrointestinal: Negative for nausea and vomiting.  Genitourinary: Negative for dysuria.  Musculoskeletal: Negative for joint swelling.  Skin: Negative for rash.  Neurological: Negative for headaches.  Hematological: Does not bruise/bleed easily.  Psychiatric/Behavioral: Negative for dysphoric mood. The patient is not nervous/anxious.        Objective:   Physical Exam Constitutional:  Well developed, no acute distress  HENT:  Nares patent without discharge, but deviated septum with narrowing bilat.  Oropharynx without exudate, palate and uvula are mildly elongated.   Eyes:  Perrla, eomi, no scleral icterus  Neck:  No JVD, no TMG  Cardiovascular:  Normal rate, regular rhythm, no rubs or gallops.  No murmurs        Intact distal pulses  Pulmonary :  Normal breath sounds, no stridor or respiratory distress   No rales, rhonchi, or wheezing  Abdominal:  Soft, nondistended, bowel sounds present.  No tenderness noted.   Musculoskeletal:  No lower extremity edema noted.  Lymph Nodes:  No cervical lymphadenopathy noted  Skin:  No cyanosis noted  Neurologic:  Alert, appropriate, moves all 4 extremities without obvious deficit.         Assessment & Plan:

## 2014-11-05 ENCOUNTER — Encounter (HOSPITAL_COMMUNITY): Payer: Self-pay | Admitting: Internal Medicine

## 2014-12-16 ENCOUNTER — Encounter: Payer: Self-pay | Admitting: Internal Medicine

## 2014-12-16 ENCOUNTER — Ambulatory Visit (INDEPENDENT_AMBULATORY_CARE_PROVIDER_SITE_OTHER): Payer: Medicare Other | Admitting: Internal Medicine

## 2014-12-16 VITALS — BP 136/84 | HR 59 | Ht 71.5 in | Wt 195.4 lb

## 2014-12-16 DIAGNOSIS — I4892 Unspecified atrial flutter: Secondary | ICD-10-CM

## 2014-12-16 DIAGNOSIS — G4733 Obstructive sleep apnea (adult) (pediatric): Secondary | ICD-10-CM | POA: Insufficient documentation

## 2014-12-16 DIAGNOSIS — I480 Paroxysmal atrial fibrillation: Secondary | ICD-10-CM

## 2014-12-16 DIAGNOSIS — I4891 Unspecified atrial fibrillation: Secondary | ICD-10-CM

## 2014-12-16 NOTE — Patient Instructions (Addendum)
Your physician has recommended you make the following change in your medication:  1) STOP Cardizem 120 mg  Your physician recommends that you schedule a follow-up appointment in: 3 months with Dr. Rayann Heman

## 2014-12-16 NOTE — Progress Notes (Signed)
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  Since his recent ablation, the patient reports doing very well.  He denies procedure related complication.  He has had no symptoms of AF.  He is trying to get used to CPAP.  Today, he denies symptoms of palpitations, chest pain, shortness of breath,  lower extremity edema, dizziness, presyncope, or syncope.  The patient is otherwise without complaint today.   Past Medical History  Diagnosis Date  . Hypertension   . BPH (benign prostatic hyperplasia)   . Hyperlipidemia   . Prostate cancer DX  12/16/13    gleason 3+3=6, volume 48 gm, s/p seed implant 02/2014.  Marland Kitchen Arthritis     JOINTS  . History of asthma     EXERCISE INDUCED ASTHMA  1996 FOR 6 MONTHS  . Atrial fibrillation   . Campylobacter diarrhea     a. 05/25/14  . Obstructive sleep apnea     using CPAP   Past Surgical History  Procedure Laterality Date  . Knee arthroscopy  09/25/2012    Procedure: ARTHROSCOPY KNEE;  Surgeon: Gearlean Alf, MD;  Location: [facility];  Service: Orthopedics;  Laterality: Left;  with medial and lateral meniscus Debridement  . Prostate biopsy  12/16/13    Gleason 6, volume 48 gm  . Right knee surgery  1975  . Radioactive seed implant N/A 03/18/2014    Procedure: RADIOACTIVE SEED IMPLANT;  Surgeon: [name], MD;  Location: [facility];  Service: Urology;  Laterality: N/A;  . Tee without cardioversion N/A 05/28/2014    Procedure: TRANSESOPHAGEAL ECHOCARDIOGRAM (TEE);  Surgeon: Lelon Perla, MD;  Location: [place];  Service: Cardiovascular;  Laterality: N/A;  . Cardioversion N/A 05/28/2014    Procedure: CARDIOVERSION;  Surgeon: Lelon Perla, MD;  Location: [facility] ENDOSCOPY;  Service: Cardiovascular;  Laterality: N/A;  . Tee without cardioversion N/A 06/09/2014    Procedure: TRANSESOPHAGEAL ECHOCARDIOGRAM (TEE);  Surgeon: Pixie Casino, MD;  Location: [facility] ENDOSCOPY;  Service: Cardiovascular;  Laterality: N/A;  . Cardioversion N/A 06/09/2014    Procedure: CARDIOVERSION;  Surgeon: Pixie Casino, MD;  Location: [facility] ENDOSCOPY;  Service: Cardiovascular;  Laterality: N/A;  . Cardioversion N/A 07/29/2014    Procedure: CARDIOVERSION;  Surgeon: Larey Dresser, MD;  Location: [place];  Service: Cardiovascular;  Laterality: N/A;  . Tee without cardioversion N/A 09/14/2014    Procedure: TRANSESOPHAGEAL ECHOCARDIOGRAM (TEE);  Surgeon: Candee Furbish, MD;  Location: [facility] ENDOSCOPY;  Service: Cardiovascular;  Laterality: N/A;  . Ablation of dysrhythmic focus  09/15/2014    dr Jeffrie Lofstrom  . Atrial fibrillation ablation N/A 09/15/2014    Procedure: ATRIAL FIBRILLATION ABLATION;  Surgeon: Coralyn Mark, MD;  Location: [place] CATH LAB;  Service: Cardiovascular;  Laterality: N/A;    ROS- all systems are reviewed and negatives except as per HPI above  Current Outpatient Prescriptions  Medication Sig Dispense Refill  . apixaban (ELIQUIS) 5 MG TABS tablet Take 1 tablet (5 mg total) by mouth 2 (two) times daily. 60 tablet 3  . diltiazem (CARDIZEM CD) 120 MG 24 hr capsule Take 1 capsule (120 mg total) by mouth daily. 30 capsule 6  . hydroxypropyl methylcellulose / hypromellose (ISOPTO TEARS / GONIOVISC) 2.5 % ophthalmic solution Place 2 drops into both eyes 2 (two) times daily.     . Multiple Vitamin (MULTIVITAMIN WITH MINERALS) TABS Take 1 tablet by mouth every evening.     Marland Kitchen  naproxen sodium (ANAPROX) 220 MG tablet Take 440 mg by mouth 2 (two) times daily as needed (for pain).     . rosuvastatin (CRESTOR) 10 MG tablet Take 5 mg by mouth every evening.     No current facility-administered medications for this visit.   Facility-Administered Medications Ordered in Other Visits  Medication Dose Route Frequency Provider Last Rate Last Dose  . 0.9 %  sodium chloride infusion   Intravenous Continuous Thompson Grayer, MD      . 0.9 %  sodium chloride  infusion   Intravenous Continuous Thompson Grayer, MD        Physical Exam: Filed Vitals:   12/16/14 0943  BP: 136/84  Pulse: 59  Height: 5' 11.5" (1.816 m)  Weight: 195 lb 6.4 oz (88.633 kg)    GEN- The patient is well appearing, alert and oriented x 3 today.   Head- normocephalic, atraumatic Eyes-  Sclera clear, conjunctiva pink Ears- hearing intact Oropharynx- clear Lungs- Clear to ausculation bilaterally, normal work of breathing Heart- Regular rate and rhythm, no murmurs, rubs or gallops, PMI not laterally displaced GI- soft, NT, ND, + BS Extremities- no clubbing, cyanosis, or edema  ekg today reveals sinus rhythm 59 bpm, PR 160, QRS 96, Qtc 401, nonspecific St/T changes  Assessment and Plan:  1. afib Doing well s/p ablation without recurrence off of AAD therapy chads2vasc score 2 (though HTN is very borderline).  Continue eliquis for now Stop diltiazem  2. OSA Compliance with CPAP  He may benefit from ENT consult or dental appliance  3. ETOH  Cessation is advised  Return in 3 months

## 2014-12-30 ENCOUNTER — Encounter: Payer: Self-pay | Admitting: Pulmonary Disease

## 2014-12-30 ENCOUNTER — Ambulatory Visit (INDEPENDENT_AMBULATORY_CARE_PROVIDER_SITE_OTHER): Payer: Medicare Other | Admitting: Pulmonary Disease

## 2014-12-30 VITALS — BP 122/70 | HR 60 | Temp 97.6°F | Ht 71.0 in | Wt 195.0 lb

## 2014-12-30 DIAGNOSIS — G4733 Obstructive sleep apnea (adult) (pediatric): Secondary | ICD-10-CM

## 2014-12-30 NOTE — Assessment & Plan Note (Signed)
The patient is wearing C Pap compliantly by his download, but is having some breakthrough apnea. He does not feel it has had a significant impact on his sleep, and it is unclear whether this is because of his breakthrough, or he is in a subset of patients that do not see a big change in quality of life.  I have reminded him of the cardiovascular benefits of C Pap with severe sleep apnea. I will have his pressure range increased to better cover his sleep apnea, and I've also asked him to work on mask fit.

## 2014-12-30 NOTE — Progress Notes (Signed)
   Subjective:    Patient ID: William Combs, male    DOB: 1946/10/05, 69 y.o.   MRN: [id number]  HPI Patient comes in today for follow-up of his obstructive sleep apnea. He is wearing C Pap compliantly by his download, but is having some breakthrough apnea. He is also having some mask leak, but the patient does not feel it is a significant issue. Despite wearing his C Pap compliantly, he has not seen a big change in his sleep or daytime alertness.   Review of Systems  Constitutional: Negative for fever and unexpected weight change.  HENT: Negative for congestion, dental problem, ear pain, nosebleeds, postnasal drip, rhinorrhea, sinus pressure, sneezing, sore throat and trouble swallowing.   Eyes: Negative for redness and itching.  Respiratory: Negative for cough, chest tightness, shortness of breath and wheezing.   Cardiovascular: Negative for palpitations and leg swelling.  Gastrointestinal: Negative for nausea and vomiting.  Genitourinary: Negative for dysuria.  Musculoskeletal: Negative for joint swelling.  Skin: Negative for rash.  Neurological: Negative for headaches.  Hematological: Does not bruise/bleed easily.  Psychiatric/Behavioral: Negative for dysphoric mood. The patient is not nervous/anxious.        Objective:   Physical Exam Well-developed male in no acute distress Nose without purulence or discharge noted No skin breakdown or pressure necrosis from the C Pap mask Neck without lymphadenopathy or thyromegaly Lower extremities without edema, no cyanosis Alert and oriented, moves all 4 extremities.       Assessment & Plan:

## 2014-12-30 NOTE — Patient Instructions (Signed)
Will increase your pressure range to 5-20cm Let me know if you continue to have mask fit issues. Work on modest weight loss followup with me again in 30mos

## 2015-03-05 ENCOUNTER — Other Ambulatory Visit: Payer: Self-pay | Admitting: Physician Assistant

## 2015-03-08 NOTE — Telephone Encounter (Signed)
Please review for refill, Thank you. 

## 2015-03-08 NOTE — Telephone Encounter (Signed)
Fill medication

## 2015-03-11 ENCOUNTER — Ambulatory Visit: Payer: Medicare Other | Admitting: Internal Medicine

## 2015-03-22 ENCOUNTER — Ambulatory Visit (INDEPENDENT_AMBULATORY_CARE_PROVIDER_SITE_OTHER): Payer: Medicare Other | Admitting: Internal Medicine

## 2015-03-22 VITALS — BP 120/78 | HR 65 | Ht 71.5 in | Wt 190.8 lb

## 2015-03-22 DIAGNOSIS — I480 Paroxysmal atrial fibrillation: Secondary | ICD-10-CM | POA: Diagnosis not present

## 2015-03-22 NOTE — Progress Notes (Signed)
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   No afib since ablation.  Doing well.   Today, he denies symptoms of palpitations, chest pain, shortness of breath,  lower extremity edema, dizziness, presyncope, or syncope.  The patient is otherwise without complaint today.   Past Medical History  Diagnosis Date  . Hypertension   . BPH (benign prostatic hyperplasia)   . Hyperlipidemia   . Prostate cancer DX  12/16/13    gleason 3+3=6, volume 48 gm, s/p seed implant 02/2014.  Marland Kitchen Arthritis     JOINTS  . History of asthma     EXERCISE INDUCED ASTHMA  1996 FOR 6 MONTHS  . Atrial fibrillation   . Campylobacter diarrhea     a. 05/25/14  . Obstructive sleep apnea     using CPAP   Past Surgical History  Procedure Laterality Date  . Knee arthroscopy  09/25/2012    Procedure: ARTHROSCOPY KNEE;  Surgeon: Gearlean Alf, MD;  Location: [facility];  Service: Orthopedics;  Laterality: Left;  with medial and lateral meniscus Debridement  . Prostate biopsy  12/16/13    Gleason 6, volume 48 gm  . Right knee surgery  1975  . Radioactive seed implant N/A 03/18/2014    Procedure: RADIOACTIVE SEED IMPLANT;  Surgeon: [name], MD;  Location: [facility];  Service: Urology;  Laterality: N/A;  . Tee without cardioversion N/A 05/28/2014    Procedure: TRANSESOPHAGEAL ECHOCARDIOGRAM (TEE);  Surgeon: Lelon Perla, MD;  Location: [place];  Service: Cardiovascular;  Laterality: N/A;  . Cardioversion N/A 05/28/2014    Procedure: CARDIOVERSION;  Surgeon: Lelon Perla, MD;  Location: [facility] ENDOSCOPY;  Service: Cardiovascular;  Laterality: N/A;  . Tee without cardioversion N/A 06/09/2014    Procedure: TRANSESOPHAGEAL ECHOCARDIOGRAM (TEE);  Surgeon: Pixie Casino, MD;  Location: [facility] ENDOSCOPY;  Service: Cardiovascular;  Laterality: N/A;  . Cardioversion N/A 06/09/2014    Procedure:  CARDIOVERSION;  Surgeon: Pixie Casino, MD;  Location: [facility] ENDOSCOPY;  Service: Cardiovascular;  Laterality: N/A;  . Cardioversion N/A 07/29/2014    Procedure: CARDIOVERSION;  Surgeon: Larey Dresser, MD;  Location: [place];  Service: Cardiovascular;  Laterality: N/A;  . Tee without cardioversion N/A 09/14/2014    Procedure: TRANSESOPHAGEAL ECHOCARDIOGRAM (TEE);  Surgeon: Candee Furbish, MD;  Location: [facility] ENDOSCOPY;  Service: Cardiovascular;  Laterality: N/A;  . Ablation of dysrhythmic focus  09/15/2014    dr Crawford Tamura  . Atrial fibrillation ablation N/A 09/15/2014    Procedure: ATRIAL FIBRILLATION ABLATION;  Surgeon: Coralyn Mark, MD;  Location: [place] CATH LAB;  Service: Cardiovascular;  Laterality: N/A;    ROS- all systems are reviewed and negatives except as per HPI above  Current Outpatient Prescriptions  Medication Sig Dispense Refill  . ELIQUIS 5 MG TABS tablet TAKE 1 TABLET TWICE A DAY (Patient taking differently: TAKE 1 TABLET BY MOUTH TWICE A DAY) 60 tablet 6  . hydroxypropyl methylcellulose / hypromellose (ISOPTO TEARS / GONIOVISC) 2.5 % ophthalmic solution Place 2 drops into both eyes 2 (two) times daily.     . Multiple Vitamin (MULTIVITAMIN WITH MINERALS) TABS Take 1 tablet by mouth every evening.     . naproxen sodium (ANAPROX) 220 MG tablet Take 440 mg by mouth 2 (two) times daily as needed (for pain).     . rosuvastatin (CRESTOR) 10 MG tablet Take 5 mg by mouth every evening.  No current facility-administered medications for this visit.   Facility-Administered Medications Ordered in Other Visits  Medication Dose Route Frequency Provider Last Rate Last Dose  . 0.9 %  sodium chloride infusion   Intravenous Continuous Thompson Grayer, MD      . 0.9 %  sodium chloride infusion   Intravenous Continuous Thompson Grayer, MD        Physical Exam: Filed Vitals:   03/22/15 1422  BP: 120/78  Pulse: 65  Height: 5' 11.5" (1.816 m)  Weight: 190 lb 12.8 oz (86.546 kg)    GEN- The  patient is well appearing, alert and oriented x 3 today.   Head- normocephalic, atraumatic Eyes-  Sclera clear, conjunctiva pink Ears- hearing intact Oropharynx- clear Lungs- Clear to ausculation bilaterally, normal work of breathing Heart- Regular rate and rhythm, no murmurs, rubs or gallops, PMI not laterally displaced GI- soft, NT, ND, + BS Extremities- no clubbing, cyanosis, or edema  ekg today reveals sinus rhythm   Assessment and Plan:  1. afib Doing well s/p ablation without recurrence off of AAD therapy chads2vasc score 2 (though HTN is very borderline).  Continue eliquis for now  2. OSA Compliant with CPAP   3. ETOH  Cessation is advised  Return in 6 months He is considering moving to [place].  He may not live in [place] at that time.  He is aware to contact me if I can assist in the future.  Thompson Grayer MD, [facility] 03/22/2015 2:39 PM

## 2015-03-22 NOTE — Patient Instructions (Signed)
Medication Instructions:  Your physician recommends that you continue on your current medications as directed. Please refer to the Current Medication list given to you today.   Labwork: None ordered  Testing/Procedures: None ordered  Follow-Up: Your physician wants you to follow-up in: 6 months with Dr Rayann Heman Dennis Bast will receive a reminder letter in the mail two months in advance. If you don't receive a letter, please call our office to schedule the follow-up appointment.     Any Other Special Instructions Will Be Listed Below (If Applicable).

## 2015-05-24 ENCOUNTER — Other Ambulatory Visit: Payer: Self-pay

## 2015-05-26 ENCOUNTER — Encounter: Payer: Self-pay | Admitting: Pulmonary Disease

## 2015-07-01 ENCOUNTER — Ambulatory Visit: Payer: Medicare Other | Admitting: Pulmonary Disease

## 2015-10-22 ENCOUNTER — Other Ambulatory Visit: Payer: Self-pay | Admitting: Internal Medicine

## 2015-12-22 ENCOUNTER — Other Ambulatory Visit: Payer: Self-pay | Admitting: Internal Medicine

## 2016-03-07 ENCOUNTER — Other Ambulatory Visit: Payer: Self-pay | Admitting: Internal Medicine
